# Patient Record
Sex: Female | Born: 1985 | Race: White | Hispanic: Yes | Marital: Married | Smoking: Never smoker
Health system: Southern US, Community
[De-identification: ages and names within clinical notes are randomized; demographics above are authoritative.]

---
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
## 2006-12-10 ENCOUNTER — Inpatient Hospital Stay (HOSPITAL_COMMUNITY): Admission: AD | Admit: 2006-12-10 | Discharge: 2006-12-12 | Payer: Self-pay | Admitting: Gynecology

## 2009-01-24 ENCOUNTER — Ambulatory Visit (HOSPITAL_COMMUNITY): Admission: RE | Admit: 2009-01-24 | Discharge: 2009-01-24 | Payer: Self-pay | Admitting: Obstetrics & Gynecology

## 2009-04-26 ENCOUNTER — Inpatient Hospital Stay (HOSPITAL_COMMUNITY): Admission: AD | Admit: 2009-04-26 | Discharge: 2009-04-28 | Payer: Self-pay | Admitting: Obstetrics & Gynecology

## 2009-04-26 ENCOUNTER — Inpatient Hospital Stay (HOSPITAL_COMMUNITY): Admission: AD | Admit: 2009-04-26 | Discharge: 2009-04-26 | Payer: Self-pay | Admitting: Family Medicine

## 2009-04-26 ENCOUNTER — Ambulatory Visit: Payer: Self-pay | Admitting: Advanced Practice Midwife

## 2010-11-29 NOTE — L&D Delivery Note (Signed)
Delivery Note At 4:05 AM a viable and healthy female was delivered via Vaginal, Spontaneous Delivery (Presentation: Right Occiput Anterior).  APGAR: 8, 9; weight 7 lb 8.5 oz (3415 g).   Placenta status: Intact, Spontaneous.  Cord: 3 vessels with the following complications: shoulder cord, reduced after delivery     Anesthesia: Epidural  Episiotomy: None Lacerations: None Est. Blood Loss (mL): 200  Mom to postpartum.  Baby to nursery-stable.  Mat Carne 09/09/2011, 4:39 AM

## 2011-03-09 LAB — CBC
HCT: 40.8 % (ref 36.0–46.0)
Hemoglobin: 14.4 g/dL (ref 12.0–15.0)
MCHC: 35.3 g/dL (ref 30.0–36.0)
MCV: 93.8 fL (ref 78.0–100.0)
Platelets: 139 10*3/uL — ABNORMAL LOW (ref 150–400)
RBC: 4.35 MIL/uL (ref 3.87–5.11)
RDW: 14.6 % (ref 11.5–15.5)
WBC: 13 10*3/uL — ABNORMAL HIGH (ref 4.0–10.5)

## 2011-03-09 LAB — RPR: RPR Ser Ql: NONREACTIVE

## 2011-04-07 ENCOUNTER — Other Ambulatory Visit: Payer: Self-pay | Admitting: Family Medicine

## 2011-04-07 DIAGNOSIS — Z3689 Encounter for other specified antenatal screening: Secondary | ICD-10-CM

## 2011-04-09 ENCOUNTER — Ambulatory Visit (HOSPITAL_COMMUNITY)
Admission: RE | Admit: 2011-04-09 | Discharge: 2011-04-09 | Disposition: A | Discharge status: Home / Self Care | Payer: Medicaid Other | Source: Ambulatory Visit | Attending: Family Medicine | Admitting: Family Medicine

## 2011-04-09 DIAGNOSIS — Z363 Encounter for antenatal screening for malformations: Secondary | ICD-10-CM | POA: Insufficient documentation

## 2011-04-09 DIAGNOSIS — Z3689 Encounter for other specified antenatal screening: Secondary | ICD-10-CM

## 2011-04-09 DIAGNOSIS — O358XX0 Maternal care for other (suspected) fetal abnormality and damage, not applicable or unspecified: Secondary | ICD-10-CM | POA: Insufficient documentation

## 2011-04-09 DIAGNOSIS — Z1389 Encounter for screening for other disorder: Secondary | ICD-10-CM | POA: Insufficient documentation

## 2011-04-30 ENCOUNTER — Inpatient Hospital Stay (HOSPITAL_COMMUNITY): Admission: AD | Admit: 2011-04-30 | Payer: Self-pay | Source: Ambulatory Visit | Admitting: Family Medicine

## 2011-09-08 ENCOUNTER — Encounter (HOSPITAL_COMMUNITY): Payer: Self-pay | Admitting: Anesthesiology

## 2011-09-08 ENCOUNTER — Encounter (HOSPITAL_COMMUNITY): Payer: Self-pay | Admitting: *Deleted

## 2011-09-08 ENCOUNTER — Inpatient Hospital Stay (HOSPITAL_COMMUNITY): Payer: Medicaid Other | Admitting: Anesthesiology

## 2011-09-08 ENCOUNTER — Inpatient Hospital Stay (HOSPITAL_COMMUNITY)
Admission: AD | Admit: 2011-09-08 | Discharge: 2011-09-10 | DRG: 775 | Disposition: A | Discharge status: Home / Self Care | Payer: Medicaid Other | Source: Ambulatory Visit | Attending: Obstetrics & Gynecology | Admitting: Obstetrics & Gynecology

## 2011-09-08 LAB — CBC
HCT: 39 % (ref 36.0–46.0)
Hemoglobin: 13.4 g/dL (ref 12.0–15.0)
MCH: 30.4 pg (ref 26.0–34.0)
MCHC: 34.4 g/dL (ref 30.0–36.0)
MCV: 88.4 fL (ref 78.0–100.0)
Platelets: 138 10*3/uL — ABNORMAL LOW (ref 150–400)
RBC: 4.41 MIL/uL (ref 3.87–5.11)
RDW: 15.5 % (ref 11.5–15.5)
WBC: 11 10*3/uL — ABNORMAL HIGH (ref 4.0–10.5)

## 2011-09-08 LAB — ANTIBODY SCREEN: Antibody Screen: NEGATIVE

## 2011-09-08 LAB — RPR: RPR: NONREACTIVE

## 2011-09-08 LAB — ABO/RH: RH Type: POSITIVE

## 2011-09-08 LAB — HIV ANTIBODY (ROUTINE TESTING W REFLEX): HIV: NONREACTIVE

## 2011-09-08 MED ORDER — HYDROXYZINE HCL 50 MG/ML IM SOLN
50.0000 mg | Freq: Four times a day (QID) | INTRAMUSCULAR | Status: DC | PRN
  Filled 2011-09-08: qty 1

## 2011-09-08 MED ORDER — LIDOCAINE HCL (PF) 1 % IJ SOLN
30.0000 mL | INTRAMUSCULAR | Status: DC | PRN
  Filled 2011-09-08: qty 30

## 2011-09-08 MED ORDER — LACTATED RINGERS IV SOLN
INTRAVENOUS | Status: DC
  Administered 2011-09-08: 125 mL/h via INTRAVENOUS

## 2011-09-08 MED ORDER — OXYTOCIN BOLUS FROM INFUSION
500.0000 mL | Freq: Once | INTRAVENOUS | Status: DC
  Filled 2011-09-08: qty 500
  Filled 2011-09-08: qty 1000

## 2011-09-08 MED ORDER — EPHEDRINE 5 MG/ML INJ
10.0000 mg | INTRAVENOUS | Status: DC | PRN
  Filled 2011-09-08 (×2): qty 4

## 2011-09-08 MED ORDER — LIDOCAINE HCL 1.5 % IJ SOLN
INTRAMUSCULAR | Status: DC | PRN
  Administered 2011-09-08 (×2): 5 mL via EPIDURAL

## 2011-09-08 MED ORDER — DIPHENHYDRAMINE HCL 50 MG/ML IJ SOLN: 12.5000 mg | INTRAMUSCULAR | Status: DC | PRN

## 2011-09-08 MED ORDER — CITRIC ACID-SODIUM CITRATE 334-500 MG/5ML PO SOLN: 30.0000 mL | ORAL | Status: DC | PRN

## 2011-09-08 MED ORDER — OXYCODONE-ACETAMINOPHEN 5-325 MG PO TABS: 2.0000 | ORAL_TABLET | ORAL | Status: DC | PRN

## 2011-09-08 MED ORDER — FENTANYL 2.5 MCG/ML BUPIVACAINE 1/10 % EPIDURAL INFUSION (WH - ANES)
INTRAMUSCULAR | Status: DC | PRN
  Administered 2011-09-08: 14 mL/h via EPIDURAL
  Administered 2011-09-09: 14 mL/h

## 2011-09-08 MED ORDER — PHENYLEPHRINE 40 MCG/ML (10ML) SYRINGE FOR IV PUSH (FOR BLOOD PRESSURE SUPPORT)
80.0000 ug | PREFILLED_SYRINGE | INTRAVENOUS | Status: DC | PRN
  Filled 2011-09-08: qty 5

## 2011-09-08 MED ORDER — ONDANSETRON HCL 4 MG/2ML IJ SOLN: 4.0000 mg | Freq: Four times a day (QID) | INTRAMUSCULAR | Status: DC | PRN

## 2011-09-08 MED ORDER — FENTANYL 2.5 MCG/ML BUPIVACAINE 1/10 % EPIDURAL INFUSION (WH - ANES)
14.0000 mL/h | INTRAMUSCULAR | Status: DC
  Filled 2011-09-08 (×2): qty 60

## 2011-09-08 MED ORDER — OXYTOCIN 20 UNITS IN LACTATED RINGERS INFUSION - SIMPLE
125.0000 mL/h | Freq: Once | INTRAVENOUS | Status: AC
  Administered 2011-09-09: 500 mL/h via INTRAVENOUS

## 2011-09-08 MED ORDER — EPHEDRINE 5 MG/ML INJ
10.0000 mg | INTRAVENOUS | Status: DC | PRN
  Filled 2011-09-08: qty 4

## 2011-09-08 MED ORDER — FLEET ENEMA 7-19 GM/118ML RE ENEM: 1.0000 | ENEMA | RECTAL | Status: DC | PRN

## 2011-09-08 MED ORDER — HYDROXYZINE HCL 50 MG PO TABS: 50.0000 mg | ORAL_TABLET | Freq: Four times a day (QID) | ORAL | Status: DC | PRN

## 2011-09-08 MED ORDER — LACTATED RINGERS IV SOLN
500.0000 mL | Freq: Once | INTRAVENOUS | Status: AC
  Administered 2011-09-08: 500 mL via INTRAVENOUS

## 2011-09-08 MED ORDER — NALBUPHINE SYRINGE 5 MG/0.5 ML
5.0000 mg | INJECTION | INTRAMUSCULAR | Status: DC | PRN
  Filled 2011-09-08: qty 0.5

## 2011-09-08 MED ORDER — LACTATED RINGERS IV SOLN: 500.0000 mL | INTRAVENOUS | Status: DC | PRN

## 2011-09-08 MED ORDER — PHENYLEPHRINE 40 MCG/ML (10ML) SYRINGE FOR IV PUSH (FOR BLOOD PRESSURE SUPPORT)
80.0000 ug | PREFILLED_SYRINGE | INTRAVENOUS | Status: DC | PRN
  Filled 2011-09-08 (×2): qty 5

## 2011-09-08 MED ORDER — IBUPROFEN 600 MG PO TABS: 600.0000 mg | ORAL_TABLET | Freq: Four times a day (QID) | ORAL | Status: DC | PRN

## 2011-09-08 MED ORDER — ACETAMINOPHEN 325 MG PO TABS: 650.0000 mg | ORAL_TABLET | ORAL | Status: DC | PRN

## 2011-09-08 NOTE — H&P (Signed)
Marissa Meyers is a 25 y.o. female [phone] with IUP at Unknown presenting for labor. Pt states she has been having regular, every 2-3 minutes, associated with none vaginal bleeding, intact, with active.  Marland Kitchen  [facility]  Prenatal History/Complications:none  Past Medical History: History reviewed. No pertinent past medical history.  Past Surgical History: History reviewed. No pertinent past surgical history.  Obstetrical History: OB History    Grav Para Term Preterm Abortions TAB SAB Ect Mult Living   4 3 3       3       Gynecological History: OB History    Grav Para Term Preterm Abortions TAB SAB Ect Mult Living   4 3 3       3       Social History: History   Social History  . Marital Status: Married    Spouse Name: N/A    Number of Children: N/A  . Years of Education: N/A   Social History Main Topics  . Smoking status: Never Smoker   . Smokeless tobacco: None  . Alcohol Use: No  . Drug Use: No  . Sexually Active: Not Currently   Other Topics Concern  . None   Social History Narrative  . None    Family History: History reviewed. No pertinent family history.  Allergies: Allergies not on file  No prescriptions prior to admission    Review of Systems - Negative except contractions, getting stronger   Blood pressure 113/61, pulse 72, temperature 98 F (36.7 C), temperature source Oral, resp. rate 18. General appearance: alert, cooperative and mild distress Lungs: clear to auscultation bilaterally Heart: regular rate and rhythm Abdomen: soft, non-tender; bowel sounds normal Pelvic: 3.5/60/-2: (change from 3/50/-2 1 hour ago) Extremities: Homans sign is negative, no sign of DVT DTR's 2+ Presentation: cephalic Baseline: 150 bpm Date/time of onset: several hours ago, Frequency: Every 2-3 minutes, Duration: 60 seconds and Intensity: moderate Dilation: 3 Effacement (%): 50 Station: -2 Exam by:: Cira Servant   Prenatal labs: ABO,  Rh:  A+   Antibody:  neg Rubella:  immune RPR:   neg HBsAg:   neg HIV:   neg GBS:   neg 1 hr Glucola 103 Genetic screening  normal Anatomy US normal   Assessment: Marissa Meyers is a 25 y.o. [id number] with an IUP at 40presenting for labor  Plan: admit   CRESENZO-DISHMAN,Shaniquia Brafford 09/08/2011, 9:38 PM

## 2011-09-08 NOTE — Anesthesia Procedure Notes (Signed)
Epidural Patient location during procedure: OB Start time: 09/08/2011 11:26 PM End time: 09/08/2011 11:34 PM Reason for block: procedure for pain  Staffing Anesthesiologist: Sandrea Hughs  Preanesthetic Checklist Completed: patient identified, site marked, surgical consent, pre-op evaluation, timeout performed, IV checked, risks and benefits discussed and monitors and equipment checked  Epidural Patient position: sitting Prep: site prepped and draped and DuraPrep Patient monitoring: continuous pulse ox and blood pressure Approach: midline Injection technique: LOR air  Needle:  Needle type: Tuohy  Needle gauge: 17 G Needle length: 9 cm Needle insertion depth: 5 cm cm Catheter type: closed end flexible Catheter size: 19 Gauge Catheter at skin depth: 10 cm Test dose: negative and 1.5% lidocaine  Assessment Events: blood not aspirated, injection not painful, no injection resistance, negative IV test and no paresthesia

## 2011-09-08 NOTE — Progress Notes (Signed)
   Marissa Meyers is a 24 y.o. G4P3003 at [redacted]w[redacted]d by ultrasound admitted for active labor  Subjective:   Objective: BP 112/55  Pulse 82  Temp(Src) 97.7 F (36.5 C) (Oral)  Resp 18  Ht 5\' 2"  (1.575 m)  Wt 69.4 kg (153 lb)  BMI 27.98 kg/m2  SpO2 99%    FHT:  FHR: 140 bpm, variability: moderate,  accelerations:  Present,  decelerations:  Absent UC:   regular, every 2-3 minutes SVE:   4/80/-2  Labs: Lab Results  Component Value Date   WBC 11.0* 09/08/2011   HGB 13.4 09/08/2011   HCT 39.0 09/08/2011   MCV 88.4 09/08/2011   PLT 138* 09/08/2011    Assessment / Plan: Spontaneous labor, progressing normally  Labor: Progressing normally Fetal Wellbeing:  Category I Pain Control:  pt requests epidural Anticipated MOD:  NSVD  CRESENZO-DISHMAN,Rector Devonshire 09/08/2011, 11:41 PM

## 2011-09-08 NOTE — Progress Notes (Signed)
Pt reports uc's since last night, but now UC's are stronger and closer together.. Denies LOF

## 2011-09-08 NOTE — Anesthesia Preprocedure Evaluation (Signed)
Anesthesia Evaluation  Name, MR# and DOB Patient awake  General Assessment Comment  Reviewed: Allergy & Precautions, H&P , NPO status , Patient's Chart, lab work & pertinent test results  Airway Mallampati: I TM Distance: >3 FB Neck ROM: full    Dental No notable dental hx.    Pulmonary    Pulmonary exam normal       Cardiovascular     Neuro/Psych Negative Neurological ROS  Negative Psych ROS   GI/Hepatic negative GI ROS Neg liver ROS    Endo/Other  Negative Endocrine ROS  Renal/GU negative Renal ROS  Genitourinary negative   Musculoskeletal negative musculoskeletal ROS (+)   Abdominal Normal abdominal exam  (+)   Peds  Hematology negative hematology ROS (+)   Anesthesia Other Findings   Reproductive/Obstetrics (+) Pregnancy                           Anesthesia Physical Anesthesia Plan  ASA: II  Anesthesia Plan: Epidural   Post-op Pain Management:    Induction:   Airway Management Planned:   Additional Equipment:   Intra-op Plan:   Post-operative Plan:   Informed Consent: I have reviewed the patients History and Physical, chart, labs and discussed the procedure including the risks, benefits and alternatives for the proposed anesthesia with the patient or authorized representative who has indicated his/her understanding and acceptance.     Plan Discussed with:   Anesthesia Plan Comments:         Anesthesia Quick Evaluation  

## 2011-09-09 ENCOUNTER — Encounter (HOSPITAL_COMMUNITY): Payer: Self-pay

## 2011-09-09 LAB — RPR: RPR Ser Ql: NONREACTIVE

## 2011-09-09 LAB — ABO/RH: ABO/RH(D): O POS

## 2011-09-09 MED ORDER — DIPHENHYDRAMINE HCL 25 MG PO CAPS: 25.0000 mg | ORAL_CAPSULE | Freq: Four times a day (QID) | ORAL | Status: DC | PRN

## 2011-09-09 MED ORDER — TETANUS-DIPHTH-ACELL PERTUSSIS 5-2.5-18.5 LF-MCG/0.5 IM SUSP: 0.5000 mL | Freq: Once | INTRAMUSCULAR | Status: DC

## 2011-09-09 MED ORDER — BENZOCAINE-MENTHOL 20-0.5 % EX AERO: 1.0000 "application " | INHALATION_SPRAY | CUTANEOUS | Status: DC | PRN

## 2011-09-09 MED ORDER — DIBUCAINE 1 % RE OINT: 1.0000 "application " | TOPICAL_OINTMENT | RECTAL | Status: DC | PRN

## 2011-09-09 MED ORDER — IBUPROFEN 600 MG PO TABS
600.0000 mg | ORAL_TABLET | Freq: Four times a day (QID) | ORAL | Status: DC
  Administered 2011-09-09 – 2011-09-10 (×5): 600 mg via ORAL
  Filled 2011-09-09 (×5): qty 1

## 2011-09-09 MED ORDER — ONDANSETRON HCL 4 MG PO TABS: 4.0000 mg | ORAL_TABLET | ORAL | Status: DC | PRN

## 2011-09-09 MED ORDER — SENNOSIDES-DOCUSATE SODIUM 8.6-50 MG PO TABS
2.0000 | ORAL_TABLET | Freq: Every day | ORAL | Status: DC
  Administered 2011-09-09: 2 via ORAL

## 2011-09-09 MED ORDER — OXYCODONE-ACETAMINOPHEN 5-325 MG PO TABS: 1.0000 | ORAL_TABLET | ORAL | Status: DC | PRN

## 2011-09-09 MED ORDER — PRENATAL PLUS 27-1 MG PO TABS
1.0000 | ORAL_TABLET | Freq: Every day | ORAL | Status: DC
  Administered 2011-09-09 – 2011-09-10 (×2): 1 via ORAL
  Filled 2011-09-09 (×2): qty 1

## 2011-09-09 MED ORDER — ZOLPIDEM TARTRATE 5 MG PO TABS: 5.0000 mg | ORAL_TABLET | Freq: Every evening | ORAL | Status: DC | PRN

## 2011-09-09 MED ORDER — ONDANSETRON HCL 4 MG/2ML IJ SOLN: 4.0000 mg | INTRAMUSCULAR | Status: DC | PRN

## 2011-09-09 MED ORDER — SIMETHICONE 80 MG PO CHEW: 80.0000 mg | CHEWABLE_TABLET | ORAL | Status: DC | PRN

## 2011-09-09 MED ORDER — WITCH HAZEL-GLYCERIN EX PADS: 1.0000 "application " | MEDICATED_PAD | CUTANEOUS | Status: DC | PRN

## 2011-09-09 MED ORDER — LANOLIN HYDROUS EX OINT: TOPICAL_OINTMENT | CUTANEOUS | Status: DC | PRN

## 2011-09-09 NOTE — Anesthesia Postprocedure Evaluation (Signed)
  Anesthesia Post-op Note  Patient: Marissa Meyers  Procedure(s) Performed: * No procedures listed *  Patient Location: Mother/Baby  Anesthesia Type: Epidural  Level of Consciousness: awake, alert  and oriented  Airway and Oxygen Therapy: Patient Spontanous Breathing  Post-op Pain: none  Post-op Assessment: Post-op Vital signs reviewed, Patient's Cardiovascular Status Stable, No headache, No backache, No residual numbness and No residual motor weakness  Post-op Vital Signs: Reviewed and stable  Complications: No apparent anesthesia complications

## 2011-09-09 NOTE — Anesthesia Postprocedure Evaluation (Signed)
Anesthesia Post Note  Patient: Marissa Meyers  Procedure(s) Performed: * No procedures listed *  Anesthesia type: Epidural  Patient location: Mother/Baby  Post pain: Pain level controlled  Post assessment: Post-op Vital signs reviewed  Last Vitals:  Filed Vitals:   09/09/11 0517  BP: 90/52  Pulse: 73  Temp:   Resp: 18    Post vital signs: Reviewed  Level of consciousness: awake  Complications: No apparent anesthesia complications

## 2011-09-09 NOTE — Progress Notes (Signed)
Encounter addended by: Madison Hickman on: 09/09/2011  8:26 AM<BR>     Documentation filed: Notes Section, Charges VN

## 2011-09-09 NOTE — Progress Notes (Signed)
Called to evaluate EFM strip:  Baseline 170's with late decels vs baseline 150's with + accels.  Fetus very active, pt afebrile, skin cool to touch.  CX 6/80/-1, + scalp stimulation.  AROM with scant amt clear fluid.  FSE applied.   FHR baseline now 150's, avg LTV, + accels, no decels.  Anticipate SVD soon.  Pt comfortable with epidural.

## 2011-09-09 NOTE — Progress Notes (Signed)
I was present for the delivery and agree with Dr. Yetta Numbers note

## 2011-09-09 NOTE — Progress Notes (Signed)
UR Chart review completed.  

## 2011-09-10 NOTE — Discharge Summary (Signed)
Obstetric Discharge Summary Reason for Admission: onset of labor on 09/08/11 Prenatal Procedures: ultrasound Intrapartum Procedures: spontaneous vaginal delivery Postpartum Procedures: none Complications-Operative and Postpartum: none Hemoglobin  Date Value Range Status  09/08/2011 13.4  12.0-15.0 (g/dL) Final     HCT  Date Value Range Status  09/08/2011 39.0  36.0-46.0 (%) Final   Hospital Course: mrs. Dede is a 25 year old G48P004 who presented at 40.2 in active labor. She delivered a baby girl via NSVD on 09/09/11.The only complication was a shoulder cord, which was quickly reduced. The postpartum period is without complication and the patient is stable and ready for discharge. She will follow up at [facility]. She is breast feeding and plans to use an IUD for future contraception.   Discharge Diagnoses: Term Pregnancy-delivered   Discharge Information: Date: 09/10/2011 Activity: pelvic rest Diet: routine Medications: None Condition: stable Instructions: refer to practice specific booklet Discharge to: home Follow-up Information    Follow up with [facility]. Make an appointment in 6 weeks.         Newborn Data: Live born female  Birth Weight: 7 lb 8.5 oz (3416 g) APGAR: 8, 9  Home with mother.  Clinton Sawyer, Gerik Coberly 09/10/2011, 1:08 PM

## 2011-09-10 NOTE — Progress Notes (Signed)
Post Partum Day 1 for NSVD Subjective: no complaints, up ad lib, voiding and tolerating PO  Objective: Blood pressure 99/63, pulse 65, temperature 97.5 F (36.4 C), temperature source Oral, resp. rate 18, height 5\' 2"  (1.575 m), weight 153 lb (69.4 kg), SpO2 99.00%, unknown if currently breastfeeding. Is currently breast feeding   Physical Exam:  General: alert, cooperative and no distress Lochia: appropriate Uterine Fundus: firm DVT Evaluation: No evidence of DVT seen on physical exam.   Basename 09/08/11 2200  HGB 13.4  HCT 39.0    Assessment/Plan: Discharge home, Breastfeeding and Contraception Mirena will be started at 6 week f/u   LOS: 2 days   Mat Carne 09/10/2011, 1:05 PM

## 2011-09-13 NOTE — Discharge Summary (Signed)
Attestation of Attending Supervision of Resident: Evaluation and management procedures were performed by the [facility] Resident under my supervision.  I have reviewed the resident's note, chart reviewed and agree with management and plan.  Marissa Meyers,Marissa Meyers 09/13/2011 12:06 PM

## 2012-03-25 ENCOUNTER — Ambulatory Visit: Payer: Self-pay | Admitting: Family Medicine

## 2012-03-25 DIAGNOSIS — J309 Allergic rhinitis, unspecified: Secondary | ICD-10-CM

## 2012-03-25 DIAGNOSIS — H1010 Acute atopic conjunctivitis, unspecified eye: Secondary | ICD-10-CM

## 2012-03-25 MED ORDER — PREDNISONE 20 MG PO TABS: 20.0000 mg | ORAL_TABLET | Freq: Every day | ORAL | Status: AC

## 2012-03-25 NOTE — Progress Notes (Signed)
  Subjective:    Patient ID: Marissa Meyers, female    DOB: 07-23-1986, 26 y.o.   MRN: [id number]  HPI  Patient presents complaining of significant facial and nasal congestion. ST, PND and otalgia Occasional cough; no SOB or wheezing  Has taken cliaritin the last 2 days.  6 mo infant; nursing mother  Review of Systems  Constitutional: Positive for fatigue. Negative for fever and chills.       Objective:   Physical Exam  Constitutional: She appears well-developed.  HENT:  Nose: Mucosal edema present.  Eyes:    Neck: Neck supple.  Cardiovascular: Normal rate, regular rhythm and normal heart sounds.   Pulmonary/Chest: Effort normal and breath sounds normal.  Lymphadenopathy:    She has cervical adenopathy.  Neurological: She is alert.  Skin: Skin is warm.        Assessment & Plan:   1. Allergic rhinitis  predniSONE (DELTASONE) 20 MG tablet  2. Conjunctivitis, allergic     See patient instructions

## 2012-03-25 NOTE — Patient Instructions (Signed)
Allergies  Claritin 1 every day  Allaway eye drops 1-2 drops in each eye twice daily  Prednisone 20 mg 1 daily for 7 days

## 2014-04-12 ENCOUNTER — Encounter (HOSPITAL_COMMUNITY): Payer: Self-pay | Admitting: Emergency Medicine

## 2014-04-12 ENCOUNTER — Emergency Department (HOSPITAL_COMMUNITY)
Admission: EM | Admit: 2014-04-12 | Discharge: 2014-04-13 | Disposition: A | Discharge status: Home / Self Care | Payer: Medicaid Other | Attending: Emergency Medicine | Admitting: Emergency Medicine

## 2014-04-12 DIAGNOSIS — S01501A Unspecified open wound of lip, initial encounter: Secondary | ICD-10-CM | POA: Insufficient documentation

## 2014-04-12 DIAGNOSIS — S01511A Laceration without foreign body of lip, initial encounter: Secondary | ICD-10-CM

## 2014-04-12 MED ORDER — HYDROCODONE-ACETAMINOPHEN 5-325 MG PO TABS
1.0000 | ORAL_TABLET | Freq: Once | ORAL | Status: AC
  Administered 2014-04-12: 1 via ORAL
  Filled 2014-04-12: qty 1

## 2014-04-12 MED ORDER — BUPIVACAINE HCL (PF) 0.5 % IJ SOLN
10.0000 mL | Freq: Once | INTRAMUSCULAR | Status: AC
  Administered 2014-04-12: 10 mL
  Filled 2014-04-12: qty 10
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
## 2014-04-13 MED ORDER — HYDROCODONE-ACETAMINOPHEN 5-325 MG PO TABS: 1.0000 | ORAL_TABLET | ORAL | Status: DC | PRN
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
## 2014-04-13 NOTE — Discharge Instructions (Signed)
Take vicodin as prescribed for severe pain.   Do not drive within four hours of taking this medication (may cause drowsiness or confusion).  Keep wound clean and dry.  Follow up with your primary care doctor or [facility] [phone]; [address]) in 7-10 days for wound recheck and suture removal.  You should be seen sooner if you develop fever, worsening pain or redness/drainage of pus at site of wound.     Laceracin facial (Facial Laceration)  Una laceracin facial es un corte en el rostro. Estas lesiones pueden ser dolorosas y Nepalcausan sangrado. Generalmente se curan rpido, pero puede ser necesario un cuidado especial para reducir las cicatrices. DIAGNSTICO  Su mdico realizar la historia clnica, preguntar detalles sobre cmo ocurri la lesin y examinar la herida para determinar cun profundo es el corte. TRATAMIENTO  Algunas laceraciones faciales no requieren sutura. Otras laceraciones quizs no puedan cerrarse debido a un aumento del riesgo de infeccin. El riesgo de infeccin y la probabilidad de que la herida se cierre correctamente dependern de diversos factores, incluido el tiempo transcurrido desde que ocurri la lesin. La herida puede limpiarse para ayudar a prevenir una infeccin. Si la herida se cierra adecuadamente, podrn indicarle analgsicos, si los necesita. Su mdico usar puntos (suturas), pegamento para heridas Turner Daniels(adhesivo) o tiras [phone] para la piel para Environmental consultantreparar la laceracin. Estos elementos mantendrn unidos los bordes de la piel para que se cure ms rpidamente y para Baristaobtener un mejor resultado cosmtico. Si es necesario, es posible que le administren una vacuna contra el ttanos. INSTRUCCIONES PARA EL CUIDADO EN EL HOGAR  Tome solo medicamentos de venta libre o recetados, segn las indicaciones del mdico.  Siga las indicaciones de su mdico para el cuidado de la herida. Estas indicaciones variarn segn la tcnica Kazakhstanutilizada para cerrar la herida. Si  tiene suturas:  Mantenga la herida limpia y [place] ([place] Islandsseca.  Si le colocaron una venda (vendaje), debe cambiarla al menos una vez al da. Adems, cambie el vendaje si este se moja o se ensucia, o segn las instrucciones de su mdico.  Lave la herida dos veces por da con agua y Dietrichjabn. Enjuguela con agua para quitar todo el Belarusjabn. Seque dando palmaditas con una toalla limpia y seca.  Despus de limpiar, aplique una delgada capa del ungento con antibitico recomendado por su mdico. Esto le ayudar a prevenir las infecciones y a Automotive engineerevitar que el vendaje se Building services engineeradhiera.  Puede ducharse despus de las primeras 24 horas. No moje la herida hasta que le hayan quitado las suturas.  Qutese las suturas segn las indicaciones de su mdico. Con las laceraciones faciales, las suturas normalmente deben retirarse despus de 4 a 5das para evitar las marcas de los puntos.  Espere algunos [employer], Incdas luego de que le hayan retirado las suturas antes de Garment/textile technologistaplicar maquillaje. En caso que tenga tiras MWNUUVOZDadhesivas para la piel:  Mantenga la herida limpia y seca.  No deje que las tiras [phone]adhesivas se mojen. Puede darse un bao pero asegrese de que la herida se mantenga seca.  Si se moja, squela dando palmaditas con una toalla limpia.  Las tiras caern por s mismas. Puede recortar las tiras a medida que la herida se [place]ub[place]. No quite las tiras Miesvilleadhesivas para la piel que an estn adheridas a la herida. Ellas se caern cuando sea el momento. En caso que le hayan aplicado adhesivo:  Podr mojar la herida solo por un memento, en la ducha o el bao. No frote ni sumerja  la herida. No practique natacin. Evite transpirar con abundancia hasta que el Somersadhesivo para la piel se haya cado solo. Despus de ducharse o darse un bao, seque el corte dando palmaditas con una toalla limpia.  No aplique medicamentos lquidos, en crema o ungentos ni maquillaje en su herida mientras el QUALCOMMadhesivo para la piel est en su lugar. Podr aflojarlo antes de que la  herida se cure.  Si tiene un vendaje, tenga cuidado de no aplicar cinta adhesiva directamente Lehman Brotherssobre el adhesivo. Esto puede hacer que el Bayportadhesivo se caiga antes de que la herida se haya curado.  Evite la exposicin prolongada a Secretary/administratorla luz solar o a las Sport and exercise psychologistlmparas solares mientras el adhesivo para la piel se Arts administratorencuentre en el lugar.  Por lo general, el adhesivo para Engineer, materialsla piel permanecer en su lugar durante 5 a 10das y Therapist, occupationalluego caer naturalmente. No quite la pelcula de Zearingadhesivo. Despus de la curacin: Una vez que la herida se haya curado, proteja la herida del sol durante un ao, colocando pantalla Statisticiansolar durante el da. Esto puede ayudar a reducir las cicatrices. La exposicin a los Hydrographic surveyorrayos ultravioletas durante el primer ao oscurecer la Training and development officercicatriz. Pueden transcurrir entre uno y dosaos hasta que la cicatriz se cure completamente y pierda el color rojo.  SOLICITE ATENCIN MDICA DE INMEDIATO SI:  Tiene enrojecimiento, dolor o hinchazn alrededor de la herida.  Observa una secrecin de color blanco amarillento (pus) en la herida.  Tiene escalofros o fiebre. ASEGRESE DE QUE:  Comprende estas instrucciones.  Controlar su afeccin.  Recibir ayuda de inmediato si no mejora o si empeora. Document Released: 11/15/2005 Document Revised: 09/05/2013 [facility] Patient Information 2014 [place], [place].

## 2014-04-18 NOTE — ED Provider Notes (Signed)
Medical screening examination/treatment/procedure(s) were performed by non-physician practitioner and as supervising physician I was immediately available for consultation/collaboration.   EKG Interpretation None        Brandt LoosenJulie Doak Mah, MD 04/18/14 [phone]

## 2014-04-20 ENCOUNTER — Ambulatory Visit: Payer: Self-pay | Admitting: Physician Assistant

## 2014-04-20 VITALS — BP 102/58 | HR 68 | Temp 98.4°F | Resp 16 | Ht 60.0 in | Wt 128.0 lb

## 2014-04-20 DIAGNOSIS — Z4802 Encounter for removal of sutures: Secondary | ICD-10-CM

## 2014-04-21 NOTE — Progress Notes (Signed)
   Subjective:    Patient ID: Carolee Rota, female    DOB: 1986-10-05, 28 y.o.   MRN: [id number]  Suture / Staple Removal     Ms. Rezai is a very pleasant 28 yr old female here for suture removal.  Sutures were placed her lower lip 04/12/14 at [facility] ED.  She reports that she is doing well.  No swelling, pain, or drainage.   Review of Systems  Constitutional: Negative for fever and chills.  Respiratory: Negative.   Cardiovascular: Negative.   Skin: Positive for wound.       Objective:   Physical Exam  Vitals reviewed. Constitutional: She is oriented to person, place, and time. She appears well-developed and well-nourished. No distress.  HENT:  Head: Normocephalic and atraumatic.  Mouth/Throat:    Well healed laceration at lower lip, includes vermilion border; edges very well approximated; sutures removed without difficulty  Eyes: Conjunctivae are normal. No scleral icterus.  Pulmonary/Chest: Effort normal.  Neurological: She is alert and oriented to person, place, and time.  Skin: Skin is warm and dry.  Psychiatric: She has a normal mood and affect. Her behavior is normal.       Assessment & Plan:  Encounter for removal of sutures   Ms. Yenter is a very pleasant 28 yr old female here for suture removal.  Sutures were removed without difficulty.  Pt to follow up as needs arise    E. Frances Furbish MHS, PA-C [facility] 5/24/20157:58 AM

## 2014-04-29 ENCOUNTER — Ambulatory Visit: Payer: Self-pay | Admitting: Physician Assistant

## 2014-04-29 VITALS — BP 112/70 | HR 65 | Temp 98.3°F | Resp 12 | Ht 59.0 in | Wt 142.0 lb

## 2014-04-29 DIAGNOSIS — S01501A Unspecified open wound of lip, initial encounter: Secondary | ICD-10-CM

## 2014-04-29 NOTE — Progress Notes (Signed)
   Subjective:    Patient ID: Marissa Meyers, female    DOB: June 07, 1986, 28 y.o.   MRN: [id number]  HPI Primary Physician: PROVIDER NOT IN SYSTEM  Chief Complaint: Lip pain  HPI: 28 y.o. female with history below presents with lower lip pain. Patient had primary repair of lip laceration at [facility] on 04/12/14 with 2 Vicryl and and 5 Prolene. Had suture removal on 04/20/14. She notes pain and and extruding suture from the wound. No drainage or discharge. Afebrile. No chills. No swelling. No erythema.    No past medical history on file.   Home Meds: Prior to Admission medications   Medication Sig Start Date End Date Taking? Authorizing Provider  HYDROcodone-acetaminophen (NORCO/VICODIN) 5-325 MG per tablet Take 1 tablet by mouth every 4 (four) hours as needed for moderate pain. 04/13/14   Arie Sabina Schinlever, PA-C    Allergies: No Known Allergies  History   Social History  . Marital Status: Married    Spouse Name: N/A    Number of Children: N/A  . Years of Education: N/A   Occupational History  . Not on file.   Social History Main Topics  . Smoking status: Never Smoker   . Smokeless tobacco: Not on file  . Alcohol Use: No  . Drug Use: No  . Sexual Activity: Not Currently   Other Topics Concern  . Not on file   Social History Narrative  . No narrative on file     Review of Systems  Constitutional: Negative for fever and chills.  Skin: Positive for wound. Negative for color change.       Objective:   Physical Exam  Physical Exam: Blood pressure 112/70, pulse 65, temperature 98.3 F (36.8 C), temperature source Oral, resp. rate 12, height 4\' 11"  (1.499 m), weight 142 lb (64.411 kg), SpO2 100.00%., Body mass index is 28.67 kg/(m^2). General: Well developed, well nourished, in no acute distress. Head: Normocephalic, atraumatic, eyes without discharge, sclera non-icteric, nares are without discharge.    Neck: Supple. Full ROM.  Lungs: Breathing is  unlabored. Heart: Regular rate. Msk:  Strength and tone normal for age. Extremities/Skin: Warm and dry. No clubbing or cyanosis. No edema. No rashes or suspicious lesions. Partially extruded Vicryl suture from well healed lip laceration. VCO. 2% plain lidocaine 0.5 cc injected for local anesthesia. Suture lifted with pickups and cut below the knot with an 11 blade. Suture removed. No drainage, erythema, warmth, or STS.  Neuro: Alert and oriented X 3. Moves all extremities spontaneously. Gait is normal. CNII-XII grossly in tact. Psych:  Responds to questions appropriately with a normal affect.        Assessment & Plan:  28 year old female partially extruded Vicryl suture from well healed lip laceration and language barrier.   1) Wound lip -Vicryl suture removed per above -Warm compresses -Follow up prn  2) Language barrier   Eula Listen, MHS, PA-C [facility] [address] [place], [place] [address] [phone] [facility] 04/29/2014 8:39 PM

## 2020-11-19 ENCOUNTER — Other Ambulatory Visit: Payer: Self-pay

## 2020-11-19 DIAGNOSIS — N6310 Unspecified lump in the right breast, unspecified quadrant: Secondary | ICD-10-CM

## 2020-12-09 ENCOUNTER — Other Ambulatory Visit: Payer: Self-pay | Admitting: Obstetrics and Gynecology

## 2020-12-09 ENCOUNTER — Encounter (INDEPENDENT_AMBULATORY_CARE_PROVIDER_SITE_OTHER): Payer: Self-pay

## 2020-12-09 ENCOUNTER — Ambulatory Visit
Admission: RE | Admit: 2020-12-09 | Discharge: 2020-12-09 | Disposition: A | Discharge status: Home / Self Care | Payer: No Typology Code available for payment source | Source: Ambulatory Visit | Attending: Obstetrics and Gynecology | Admitting: Obstetrics and Gynecology

## 2020-12-09 ENCOUNTER — Other Ambulatory Visit: Payer: Self-pay

## 2020-12-09 ENCOUNTER — Ambulatory Visit: Payer: No Typology Code available for payment source | Admitting: *Deleted

## 2020-12-09 VITALS — BP 114/74 | Wt 142.8 lb

## 2020-12-09 DIAGNOSIS — N6311 Unspecified lump in the right breast, upper outer quadrant: Secondary | ICD-10-CM

## 2020-12-09 DIAGNOSIS — N6310 Unspecified lump in the right breast, unspecified quadrant: Secondary | ICD-10-CM

## 2020-12-09 DIAGNOSIS — Z1239 Encounter for other screening for malignant neoplasm of breast: Secondary | ICD-10-CM

## 2020-12-10 NOTE — Progress Notes (Addendum)
[name] is a 35 y.o. female who presents to [facility] clinic today with complaint of right breast lump x around one year.    Pap Smear: Pap smear not completed today. Last Pap smear was 07/15/2020 at the [facility] clinic and was normal with negative HPV. Per patient has no history of an abnormal Pap smear. Last Pap smear result is available in Epic.   Physical exam: Breasts Breasts symmetrical. No skin abnormalities bilateral breasts. No nipple retraction bilateral breasts. No nipple discharge bilateral breasts. No lymphadenopathy. No lumps palpated left breast. Palpated two lumps within the right breast at 10 o'clock 3 cm from the nipple and a bb sized lump at 10 o'clock next to areola. No complaints of pain or tenderness on exam.    Pelvic/Bimanual Pap is not indicated today per BCCCP guidelines.   Smoking History: Patient has never smoked.   Patient Navigation: Patient education provided. Access to services provided for patient through [place] program. Spanish interpreter Marissa Meyers from [employer] [facility] [employer] provided.   Breast and Cervical Cancer Risk Assessment: Patient does not have family history of breast cancer, known genetic mutations, or radiation treatment to the chest before age 4. Patient does not have history of cervical dysplasia, immunocompromised, or DES exposure in-utero. Breast cancer risk assessment completed. No breast cancer risk calculated due to patient is less than 34 years old.  Risk Assessment    Risk Scores      12/09/2020   Last edited by: Marissa Rutherford, LPN   5-year risk:    Lifetime risk:          A: BCCCP exam without pap smear Complaint of right breast lump.  P: Referred patient to the [facility] for a diagnostic mammogram. Appointment scheduled Tuesday, December 09, 2020 at 1320.  Marissa Heidelberg, RN 12/09/2020 11:00 AM

## 2020-12-10 NOTE — Patient Instructions (Signed)
Explained breast self awareness with Marissa Meyers. Patient did not need a Pap smear today due to last Pap smear and HPV typing was 07/15/2020. Let her know BCCCP will cover Pap smears and HPV typing every 5 years unless has a history of abnormal Pap smears. Referred patient to the [facility] for a diagnostic mammogram. Appointment scheduled Tuesday, December 09, 2020 at 1320. Patient aware of appointment and will be there. Marissa Meyers verbalized understanding.  Falisa Lamora, Kathaleen Maser, RN 11:00 AM

## 2020-12-22 ENCOUNTER — Other Ambulatory Visit: Payer: Self-pay

## 2020-12-22 ENCOUNTER — Ambulatory Visit
Admission: RE | Admit: 2020-12-22 | Discharge: 2020-12-22 | Disposition: A | Discharge status: Home / Self Care | Payer: No Typology Code available for payment source | Source: Ambulatory Visit | Attending: Obstetrics and Gynecology | Admitting: Obstetrics and Gynecology

## 2020-12-22 DIAGNOSIS — N6310 Unspecified lump in the right breast, unspecified quadrant: Secondary | ICD-10-CM

## 2021-08-02 IMAGING — MG MM BREAST LOCALIZATION CLIP
4 series · 4 of 12 positions shown · non-contrast
Comparison: Prior films

CLINICAL DATA: Status post ultrasound-guided core biopsy of right
breast mass

EXAM:
DIAGNOSTIC right MAMMOGRAM POST ultrasound BIOPSY

[R ML synth-2D]
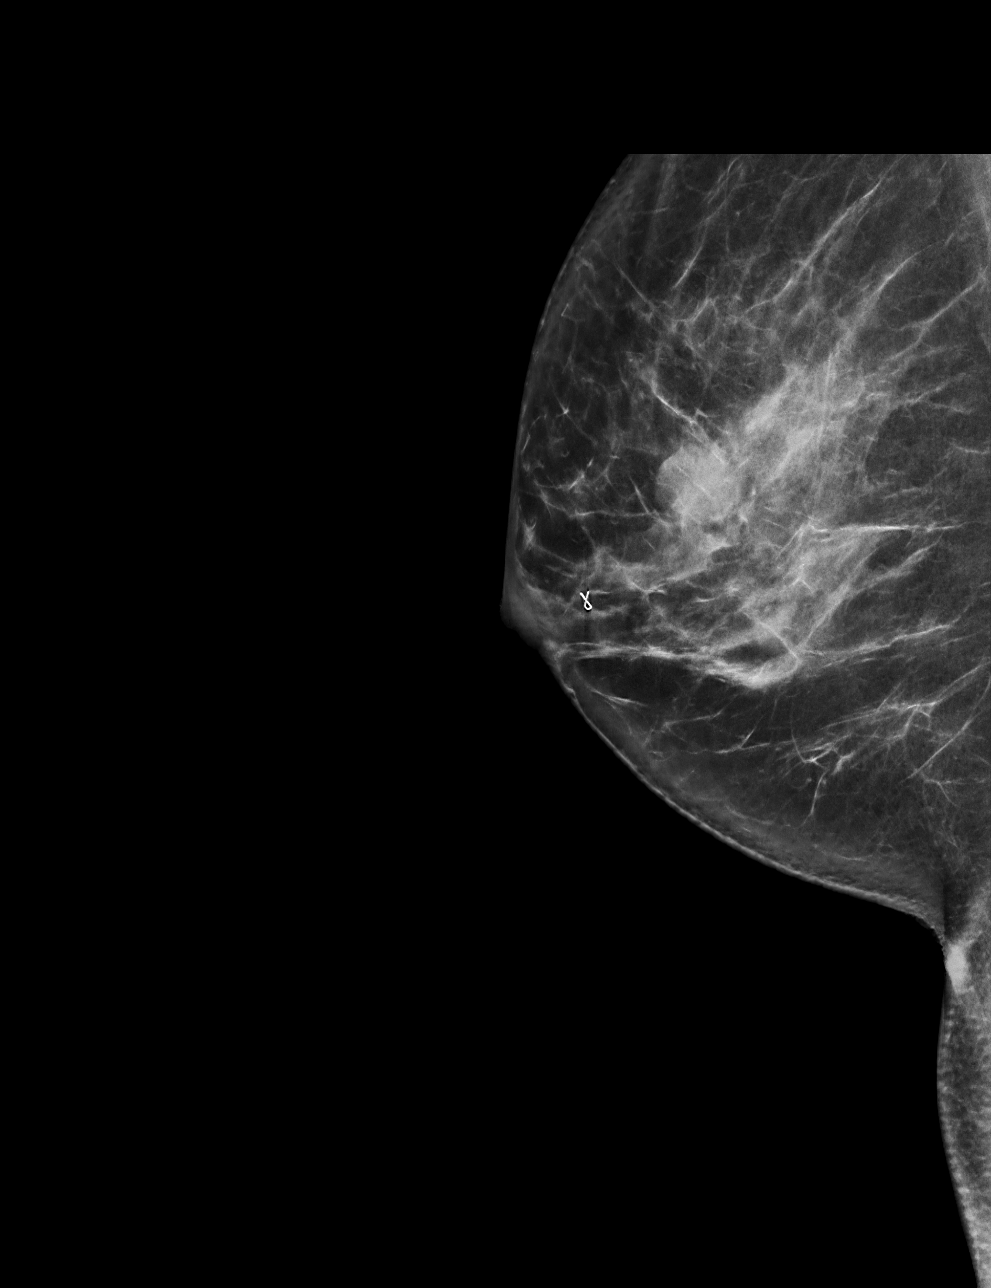

[R CC synth-2D]
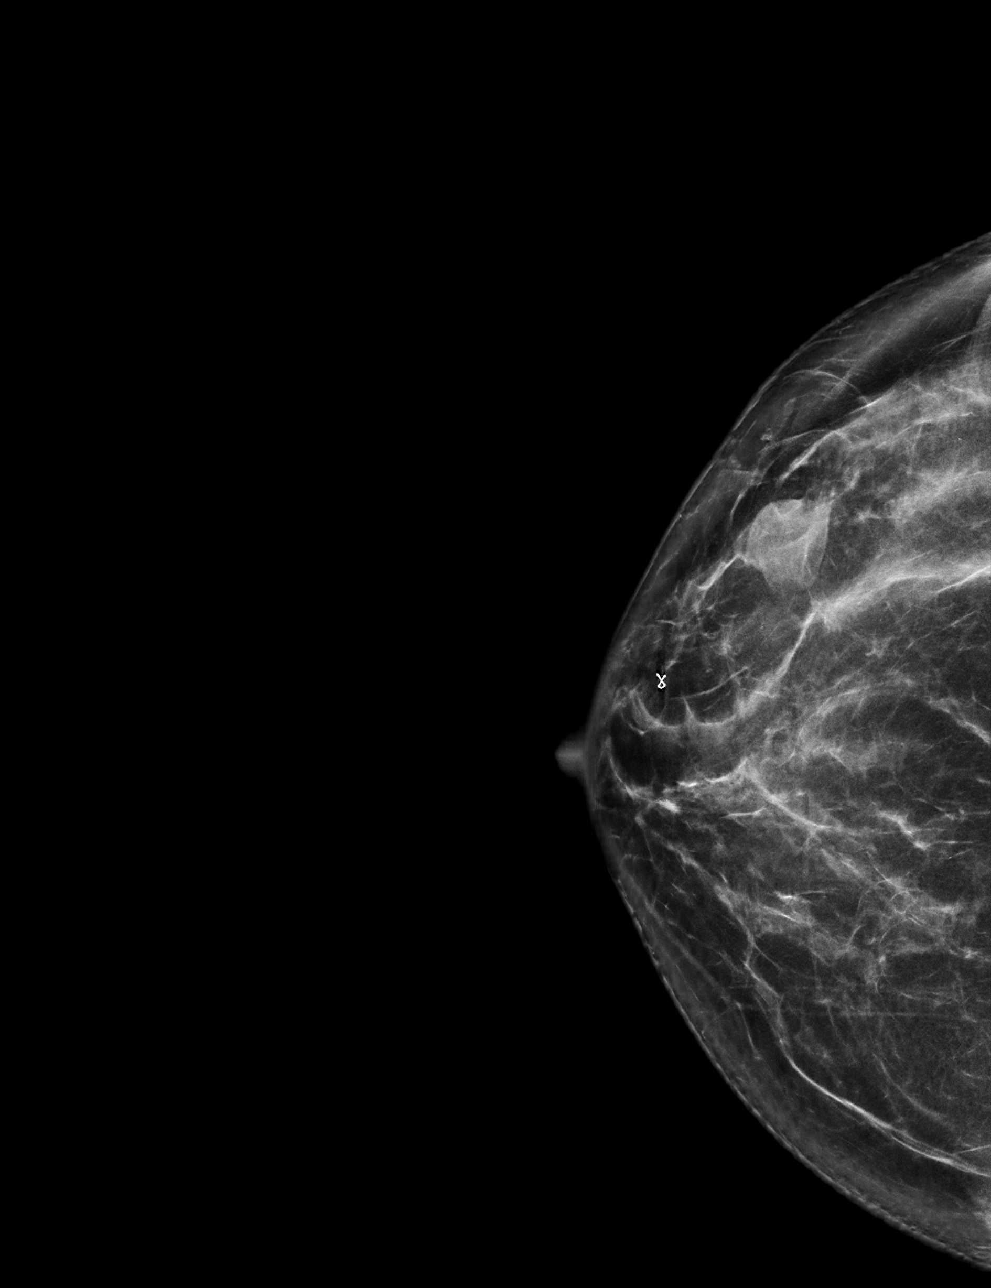

[R CC tomo · tomo slice 33/66.0]
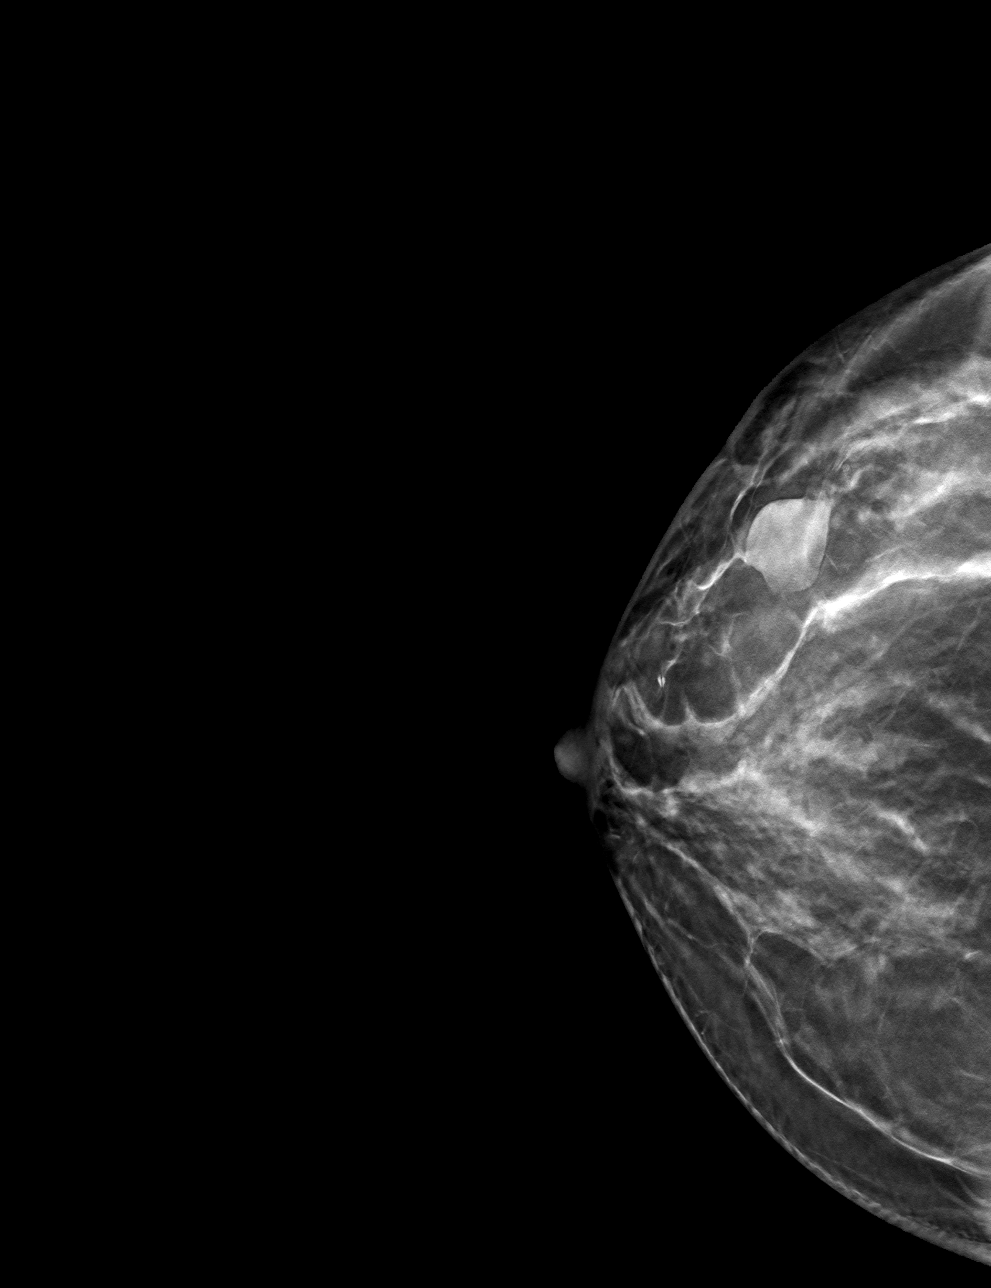

[R ML tomo · tomo slice 39/76.0]
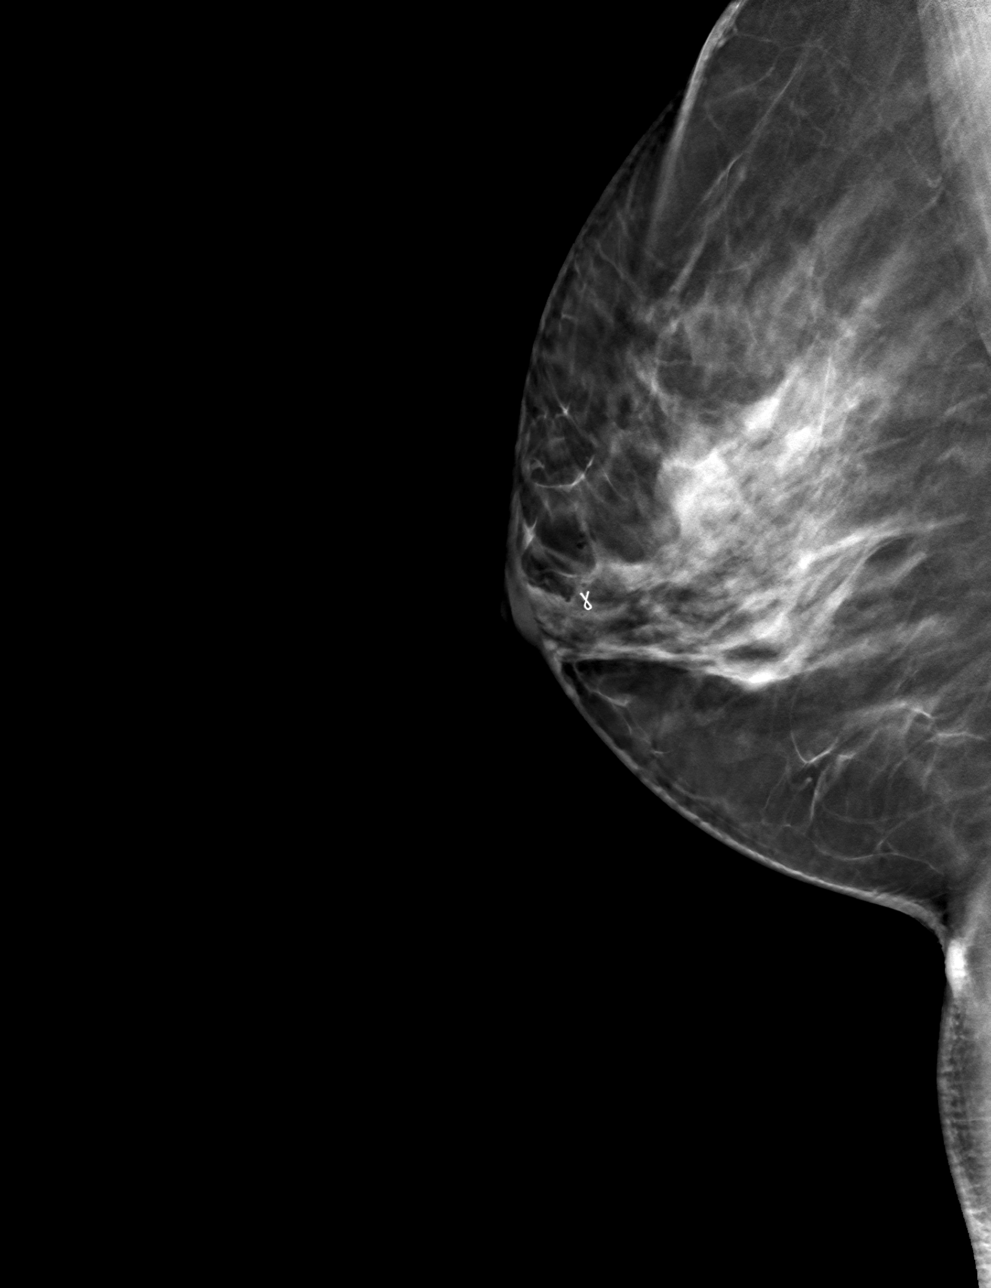

[4 of 12 positions shown; findings below may reference images not displayed]

FINDINGS: Mammographic images were obtained following ultrasound guided biopsy
of mass at right breast 10 o'clock 1 cm from nipple. The biopsy
marking clip is in expected position at the site of biopsy.
IMPRESSION: Appropriate positioning of the ribbon shaped biopsy marking clip at
the site of biopsy in the expected area of concern.

Final Assessment: Post Procedure Mammograms for Marker Placement

## 2021-08-02 IMAGING — US US  BREAST BX W/ LOC DEV 1ST LESION IMG BX SPEC US GUIDE*R*
1 series · 6 of 6 positions shown · non-contrast
Comparison: Previous exam(s).
COMPARISON: Previous exam(s).

Addendum:
CLINICAL DATA: Right breast mass for biopsy

EXAM:
ULTRASOUND GUIDED right BREAST CORE NEEDLE BIOPSY

[Series 1: us breast bx w/ loc dev 1st lesion img bx spec us  · 0.07mm/px · 6 of 6 slices shown]
[im 1/6]
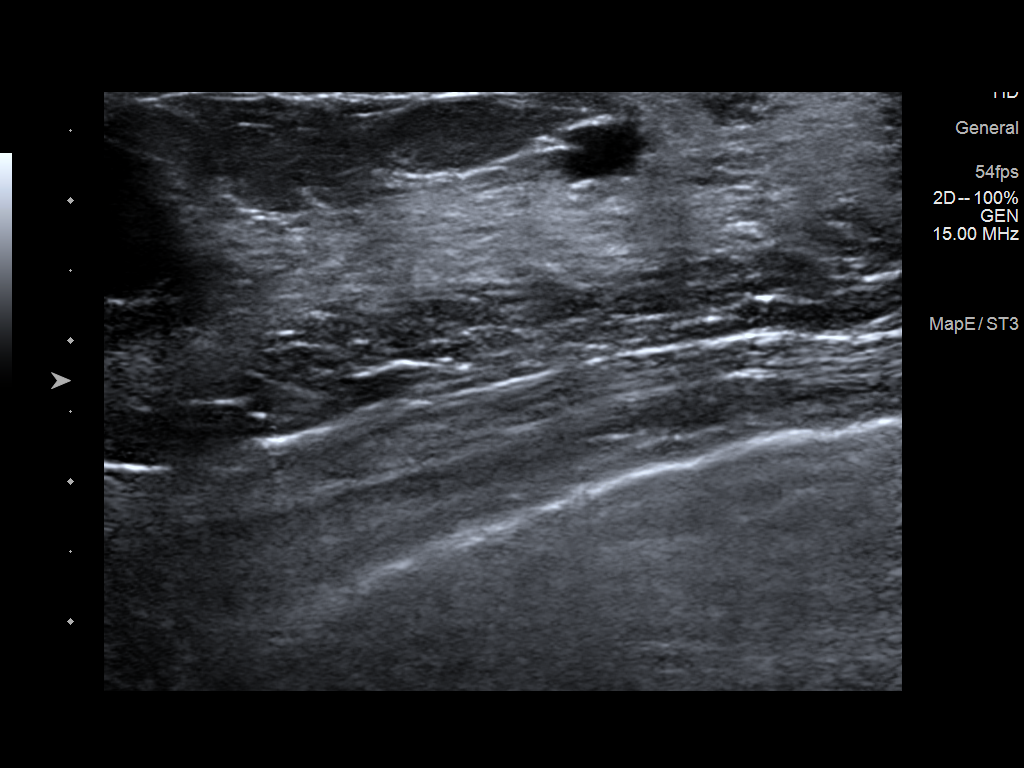
[im 2/6]
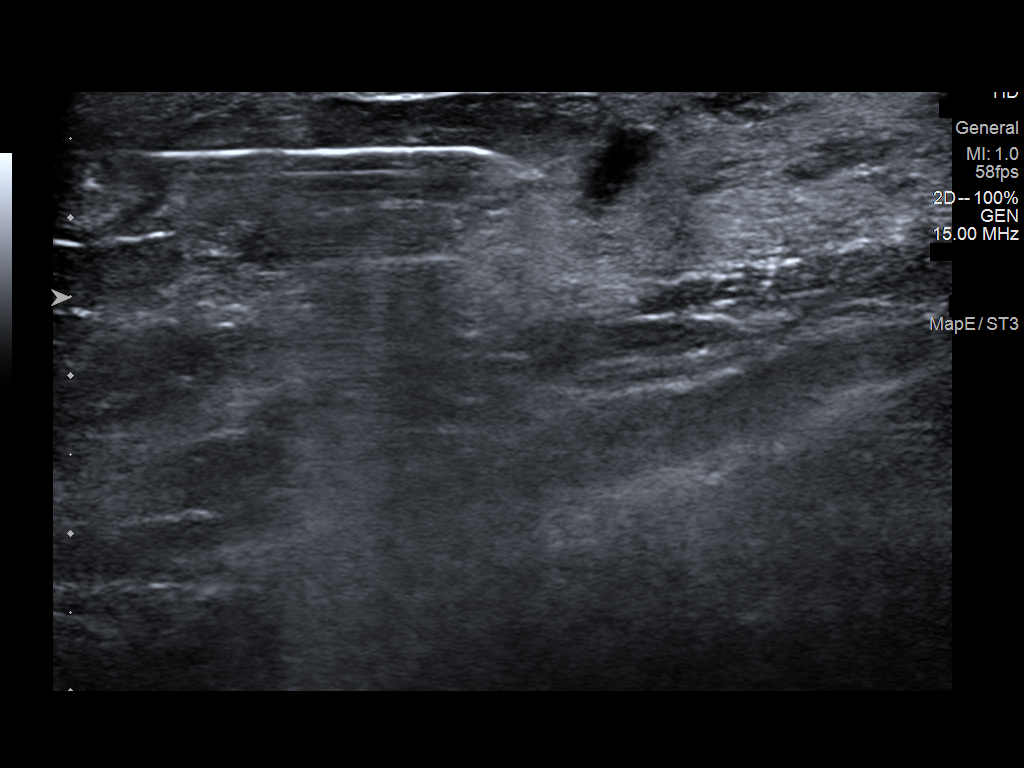
[im 3/6]
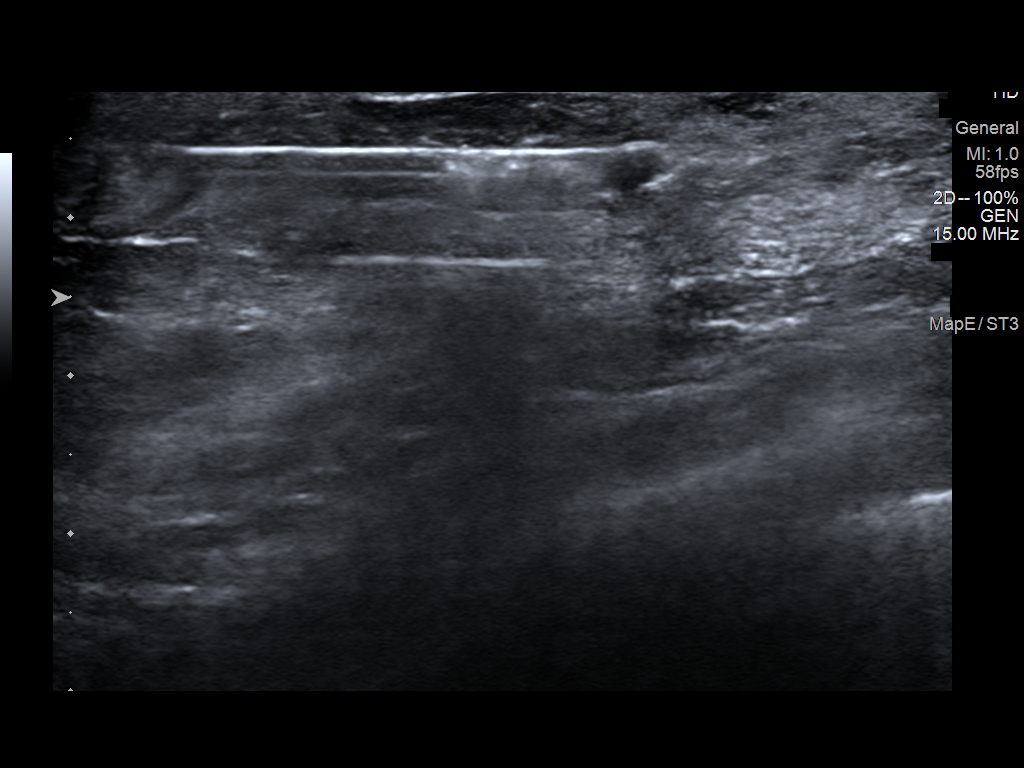
[im 4/6]
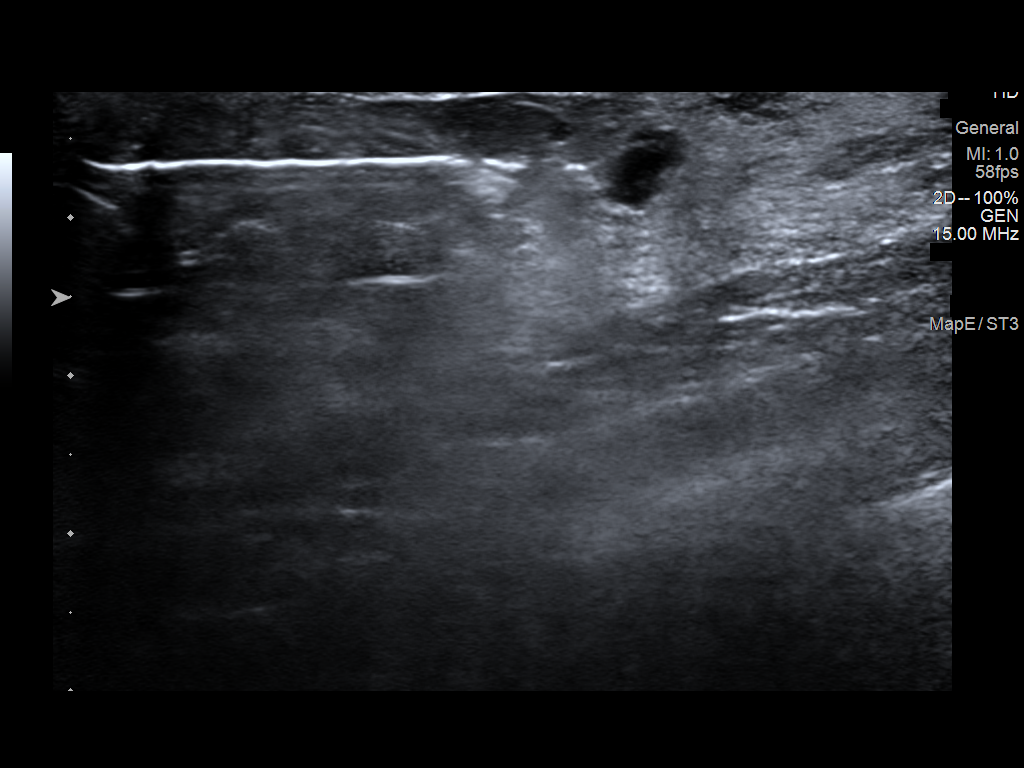
[im 5/6]
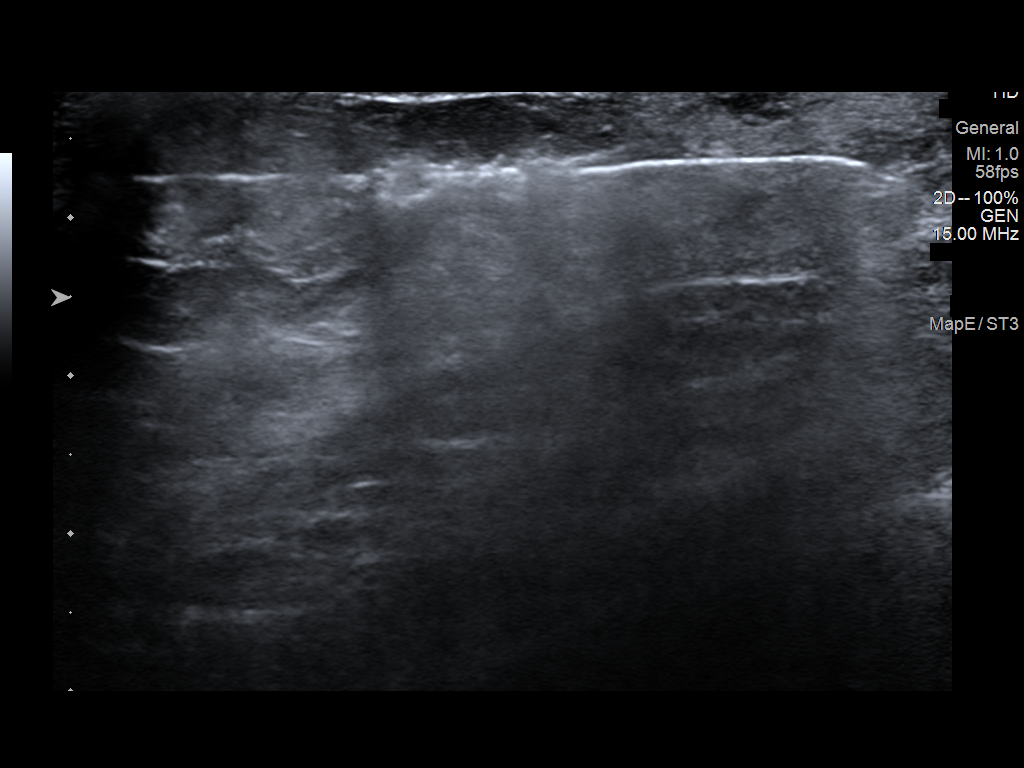
[im 6/6]
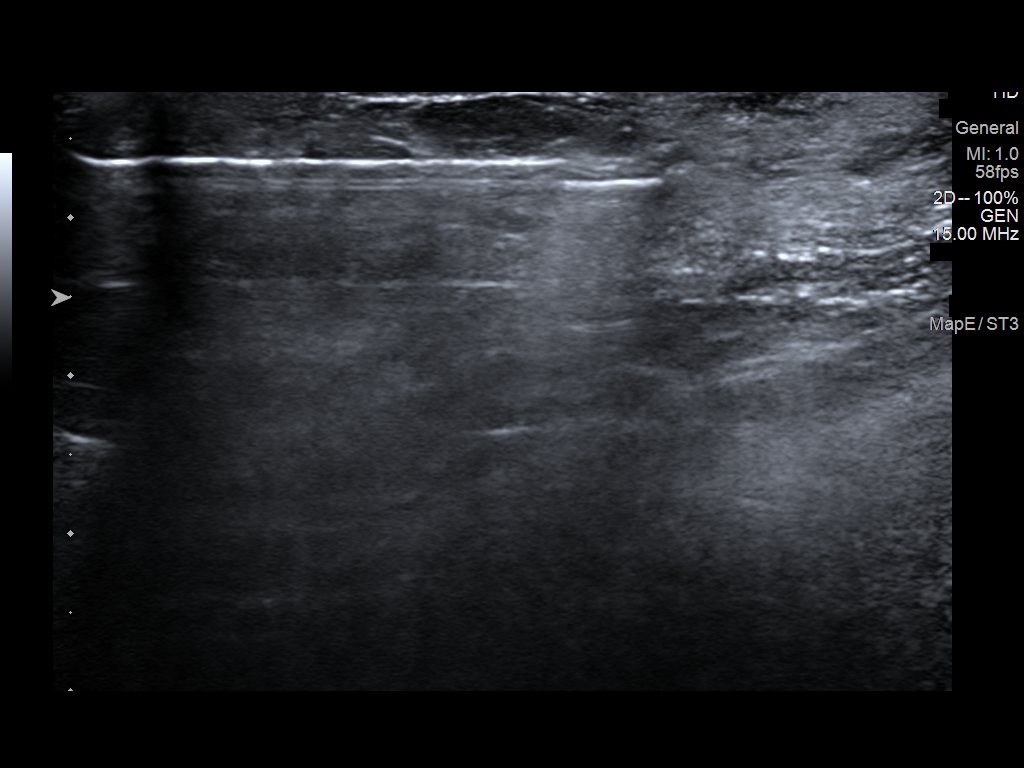

[6 of 6 positions shown; findings below may reference images not displayed]



Lesion quadrant: Upper-outer quadrant

Using sterile technique and 1% Lidocaine as local anesthetic, under
direct ultrasound visualization, a 14 gauge Prionti device was
used to perform biopsy of near anechoic mass at the right breast 10
o'clock 1 cm from nipple using a bilateral approach. At the
conclusion of the procedure ribbon tissue marker clip was deployed
into the biopsy cavity. Follow up 2 view mammogram was performed and
dictated separately.
IMPRESSION: Ultrasound guided biopsy of right breast. No apparent complications.

ADDENDUM:
Pathology revealed FIBROCYSTIC CHANGE of the Right breast, 10
o'clock, 1 cmfn. This was found to be concordant by Dr. Ebadat
Tetsop.

Pathology results were discussed with the patient by telephone by
patient reported doing well after the biopsy with tenderness at the
site. Post biopsy instructions and care were reviewed and questions
were answered. The patient was encouraged to call The [REDACTED]

The patient was asked to return for a Right breast ultrasound in 6
months, per previous diagnostic report.

Pathology results reported by Ariella Ehlers, RN on 12/23/2020.



Lesion quadrant: Upper-outer quadrant

Using sterile technique and 1% Lidocaine as local anesthetic, under
direct ultrasound visualization, a 14 gauge Prionti device was
used to perform biopsy of near anechoic mass at the right breast 10
o'clock 1 cm from nipple using a bilateral approach. At the
conclusion of the procedure ribbon tissue marker clip was deployed
into the biopsy cavity. Follow up 2 view mammogram was performed and
dictated separately.
IMPRESSION: Ultrasound guided biopsy of right breast. No apparent complications.

## 2022-02-23 ENCOUNTER — Ambulatory Visit: Payer: Self-pay | Admitting: Critical Care Medicine

## 2022-04-01 ENCOUNTER — Ambulatory Visit: Payer: Self-pay | Attending: Critical Care Medicine | Admitting: Critical Care Medicine

## 2022-04-01 ENCOUNTER — Encounter: Payer: Self-pay | Admitting: Critical Care Medicine

## 2022-04-01 VITALS — BP 98/69 | HR 74 | Temp 98.7°F | Resp 16 | Ht 60.75 in | Wt 147.0 lb

## 2022-04-01 DIAGNOSIS — Z975 Presence of (intrauterine) contraceptive device: Secondary | ICD-10-CM

## 2022-04-01 DIAGNOSIS — K219 Gastro-esophageal reflux disease without esophagitis: Secondary | ICD-10-CM

## 2022-04-01 DIAGNOSIS — Z139 Encounter for screening, unspecified: Secondary | ICD-10-CM

## 2022-04-01 DIAGNOSIS — Z1159 Encounter for screening for other viral diseases: Secondary | ICD-10-CM

## 2022-04-01 DIAGNOSIS — N6311 Unspecified lump in the right breast, upper outer quadrant: Secondary | ICD-10-CM

## 2022-04-01 MED ORDER — FAMOTIDINE 20 MG PO TABS: 20.0000 mg | ORAL_TABLET | Freq: Every day | ORAL | 6 refills | Status: AC

## 2022-04-01 NOTE — Progress Notes (Signed)
Concerns with pressure and burning in chest for 2 years.  ? ?

## 2022-04-01 NOTE — Patient Instructions (Addendum)
Please review lifestyle medicine handout for healthy food options and exercise options ? ?See attachment regarding food options to focus on to reduce acid and take Pepcid 1 pill daily for acid sent to your pharmacy [facility] ? ?Complete health screenings will be obtained ? ?Ultrasound of right breast will be ordered ? ?Please pick up orange card application on the way out you will have to process this and mail it and documents back in once approved we can consider you for a tetanus vaccine ? ?Return to see Dr. Joya Gaskins 6 months ? ? ? ?Revise el folleto de medicina de estilo de vida para conocer opciones de alimentos saludables y opciones de ejercicio. ? ?Consulte el archivo adjunto con respecto a las opciones de alimentos en las que concentrarse para reducir el ?cido y tome Pepcid 1 pastilla al d?a para el ?cido ? ?Se obtendr?n ex?menes de salud completos. ? ?Se ordenar? ecograf?a de mama derecha. ? ?Recoja la solicitud de la tarjeta naranja al salir. Tendr? que procesarla y enviarla por correo junto con los documentos una vez [place], [place] considerarlo para una vacuna contra el t?tanos. ? ?Volver a ver al Dr. Joya Gaskins 6 meses ?

## 2022-04-01 NOTE — Assessment & Plan Note (Signed)
Complete set of health screenings will be obtained ?

## 2022-04-01 NOTE — Assessment & Plan Note (Signed)
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

## 2022-04-01 NOTE — Assessment & Plan Note (Signed)
Nexplanon in place management per health department ?

## 2022-04-01 NOTE — Assessment & Plan Note (Signed)
Chest pressure syndrome and reflux symptoms patient given reflux diet and will begin Pepcid 20 mg daily and observe ?

## 2022-04-01 NOTE — Progress Notes (Signed)
? ?New Patient Office Visit ? ?Subjective   ? ?Patient ID: Marissa Meyers, female    DOB: 04-15-1986  Age: 36 y.o. MRN: [id number] ? ?CC:  ?Chief Complaint  ?Patient presents with  ? New Patient (Initial Visit)  ? ? ?HPI ?Marissa Meyers presents to establish care ?Patient is here to establish care and in person Spanish interpreter's zoila was present for the visit ?The patient has a history of a lump in the right breast she had imaging done in 2022 and they wanted to repeat ultrasound in 6 months that she is yet to occur.  She does have a Nexplanon implant and has been changed every 3 years this is done at the health department.  Last menstrual period was 5 months ago.  The patient is trying to apply for orange card at this time.  She has occasional chest pressure and reflux symptoms.  There are no other complaints. ? [facility]stream - 04/01/22 0940   ? ?  ? Pregnancy Intention Screening  ? Does the patient want to become pregnant in the next year? No   ? Does the patient's partner want to become pregnant in the next year? No   ? Would the patient like to discuss contraceptive options today? N/A   ?  ? Contraception Wrap Up  ? Current Method Hormonal Implant   ? Contraception Counseling Provided Yes   ? ?  ?  ? ?  ? ?Outpatient Encounter Medications as of 04/01/2022  ?Medication Sig  ? famotidine (PEPCID) 20 MG tablet Take 1 tablet (20 mg total) by mouth daily.  ? [DISCONTINUED] HYDROcodone-acetaminophen (NORCO/VICODIN) 5-325 MG per tablet Take 1 tablet by mouth every 4 (four) hours as needed for moderate pain.  ? ?No facility-administered encounter medications on file as of 04/01/2022.  ? ? ?History reviewed. No pertinent past medical history. ? ?History reviewed. No pertinent surgical history. ? ?Family History  ?Problem Relation Age of Onset  ? Healthy Mother   ? Healthy Father   ? ? ?Social History  ? ?Socioeconomic History  ? Marital status: Married  ?  Spouse name: Not on file  ? Number of children: 4  ?  Years of education: Not on file  ? Highest education level: 8th grade  ?Occupational History  ? Not on file  ?Tobacco Use  ? Smoking status: Never  ? Smokeless tobacco: Never  ?Vaping Use  ? Vaping Use: Never used  ?Substance and Sexual Activity  ? Alcohol use: No  ? Drug use: No  ? Sexual activity: Not Currently  ?  Birth control/protection: Implant  ?Other Topics Concern  ? Not on file  ?Social History Narrative  ? Not on file  ? ?Social Determinants of Health  ? ?Financial Resource Strain: Not on file  ?Food Insecurity: Not on file  ?Transportation Needs: Not on file  ?Physical Activity: Not on file  ?Stress: Not on file  ?Social Connections: Not on file  ?Intimate Partner Violence: Not on file  ? ? ?Review of Systems  ?Constitutional:  Negative for chills, diaphoresis, fever, malaise/fatigue and weight loss.  ?HENT:  Negative for congestion, ear discharge, ear pain, hearing loss, nosebleeds, sore throat and tinnitus.   ?Eyes:  Negative for blurred vision, double vision, photophobia, discharge and redness.  ?Respiratory: Negative.  Negative for cough, hemoptysis, sputum production, shortness of breath, wheezing and stridor.   ?     No excess mucus  ?Cardiovascular:  Positive for chest pain. Negative for palpitations, orthopnea, claudication, leg swelling  and PND.  ?     Right sided chest pressure  ?Gastrointestinal:  Positive for heartburn. Negative for abdominal pain, blood in stool, constipation, diarrhea, melena, nausea and vomiting.  ?     Acid heartburn   ?Genitourinary:  Negative for dysuria, flank pain, frequency, hematuria and urgency.  ?Musculoskeletal:  Negative for back pain, falls, joint pain, myalgias and neck pain.  ?Skin:  Negative for itching and rash.  ?Neurological:  Negative for dizziness, tingling, tremors, sensory change, speech change, focal weakness, seizures, loss of consciousness, weakness and headaches.  ?Endo/Heme/Allergies:  Negative for environmental allergies and polydipsia. Does not  bruise/bleed easily.  ?Psychiatric/Behavioral:  Negative for depression, hallucinations, memory loss, substance abuse and suicidal ideas. The patient is not nervous/anxious and does not have insomnia.   ?All other systems reviewed and are negative. ? ?  ? ? ?Objective   ? ?BP 98/69 (BP Location: Left Arm, Patient Position: Sitting, Cuff Size: Normal)   Pulse 74   Temp 98.7 ?F (37.1 ?C) (Oral)   Resp 16   Ht 5' 0.75" (1.543 m)   Wt 147 lb (66.7 kg)   SpO2 98%   BMI 28.00 kg/m?  ? ?Physical Exam ?Vitals reviewed.  ?Constitutional:   ?   Appearance: Normal appearance. She is well-developed. She is not diaphoretic.  ?HENT:  ?   Head: Normocephalic and atraumatic.  ?   Nose: No nasal deformity, septal deviation, mucosal edema or rhinorrhea.  ?   Right Sinus: No maxillary sinus tenderness or frontal sinus tenderness.  ?   Left Sinus: No maxillary sinus tenderness or frontal sinus tenderness.  ?   Mouth/Throat:  ?   Pharynx: No oropharyngeal exudate.  ?Eyes:  ?   General: No scleral icterus. ?   Conjunctiva/sclera: Conjunctivae normal.  ?   Pupils: Pupils are equal, round, and reactive to light.  ?Neck:  ?   Thyroid: No thyromegaly.  ?   Vascular: No carotid bruit or JVD.  ?   Trachea: Trachea normal. No tracheal tenderness or tracheal deviation.  ?Cardiovascular:  ?   Rate and Rhythm: Normal rate and regular rhythm.  ?   Chest Wall: PMI is not displaced.  ?   Pulses: Normal pulses. No decreased pulses.  ?   Heart sounds: Normal heart sounds, S1 normal and S2 normal. Heart sounds not distant. No murmur heard. ?No systolic murmur is present.  ?No diastolic murmur is present.  ?  No friction rub. No gallop. No S3 or S4 sounds.  ?Pulmonary:  ?   Effort: No tachypnea, accessory muscle usage or respiratory distress.  ?   Breath sounds: No stridor. No decreased breath sounds, wheezing, rhonchi or rales.  ?Chest:  ?   Chest wall: No tenderness.  ?Abdominal:  ?   General: Bowel sounds are normal. There is no distension.  ?    Palpations: Abdomen is soft. Abdomen is not rigid.  ?   Tenderness: There is no abdominal tenderness. There is no guarding or rebound.  ?Musculoskeletal:     ?   General: Normal range of motion.  ?   Cervical back: Normal range of motion and neck supple. No edema, erythema or rigidity. No muscular tenderness. Normal range of motion.  ?Lymphadenopathy:  ?   Head:  ?   Right side of head: No submental or submandibular adenopathy.  ?   Left side of head: No submental or submandibular adenopathy.  ?   Cervical: No cervical adenopathy.  ?Skin: ?  General: Skin is warm and dry.  ?   Coloration: Skin is not pale.  ?   Findings: No rash.  ?   Nails: There is no clubbing.  ?Neurological:  ?   Mental Status: She is alert and oriented to person, place, and time.  ?   Sensory: No sensory deficit.  ?Psychiatric:     ?   Speech: Speech normal.     ?   Behavior: Behavior normal.  ? ? ? ?  ? ?Assessment & Plan:  ? ?Problem List Items Addressed This Visit   ? ?  ? Digestive  ? Gastroesophageal reflux disease without esophagitis  ?  Chest pressure syndrome and reflux symptoms patient given reflux diet and will begin Pepcid 20 mg daily and observe ? ?  ?  ? Relevant Medications  ? famotidine (PEPCID) 20 MG tablet  ?  ? Other  ? Mass of upper outer quadrant of right breast  ?  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 ?  ?  ? Relevant Orders  ? US BREAST LTD UNI RIGHT INC AXILLA  ? Encounter for health-related screening  ?  Complete set of health screenings will be obtained ? ?  ?  ? Relevant Orders  ? Comprehensive metabolic panel  ? CBC with Differential/Platelet  ? Lipid panel  ? Thyroid Panel With TSH  ? Hemoglobin A1c  ? Nexplanon in place  ?  Nexplanon in place management per health department ? ?  ?  ? ?Other Visit Diagnoses   ? ? Need for hepatitis C screening test    -  Primary  ? Relevant Orders  ? HCV Ab w Reflex to Quant PCR   ? ?  ? ?Patient given application for the orange card when she achieves this we can give her a tetanus shot ?Return in about 6 months (around 10/02/2022).  ? ?Shan Levans, MD ? ? ?

## 2022-04-02 LAB — HEMOGLOBIN A1C
Est. average glucose Bld gHb Est-mCnc: 97 mg/dL
Hgb A1c MFr Bld: 5 % (ref 4.8–5.6)

## 2022-04-02 LAB — CBC WITH DIFFERENTIAL/PLATELET
Basophils Absolute: 0.1 10*3/uL (ref 0.0–0.2)
Basos: 1 %
EOS (ABSOLUTE): 0.4 10*3/uL (ref 0.0–0.4)
Eos: 4 %
Hematocrit: 42.8 % (ref 34.0–46.6)
Hemoglobin: 14 g/dL (ref 11.1–15.9)
Immature Grans (Abs): 0.1 10*3/uL (ref 0.0–0.1)
Immature Granulocytes: 1 %
Lymphocytes Absolute: 2.3 10*3/uL (ref 0.7–3.1)
Lymphs: 24 %
MCH: 29.5 pg (ref 26.6–33.0)
MCHC: 32.7 g/dL (ref 31.5–35.7)
MCV: 90 fL (ref 79–97)
Monocytes Absolute: 0.6 10*3/uL (ref 0.1–0.9)
Monocytes: 6 %
Neutrophils Absolute: 6.3 10*3/uL (ref 1.4–7.0)
Neutrophils: 64 %
Platelets: 237 10*3/uL (ref 150–450)
RBC: 4.74 x10E6/uL (ref 3.77–5.28)
RDW: 12.7 % (ref 11.7–15.4)
WBC: 9.8 10*3/uL (ref 3.4–10.8)

## 2022-04-02 LAB — COMPREHENSIVE METABOLIC PANEL
ALT: 18 IU/L (ref 0–32)
AST: 11 IU/L (ref 0–40)
Albumin/Globulin Ratio: 1.8 (ref 1.2–2.2)
Albumin: 4.8 g/dL (ref 3.8–4.8)
Alkaline Phosphatase: 117 IU/L (ref 44–121)
BUN/Creatinine Ratio: 17 (ref 9–23)
BUN: 10 mg/dL (ref 6–20)
Bilirubin Total: 1 mg/dL (ref 0.0–1.2)
CO2: 22 mmol/L (ref 20–29)
Calcium: 9.6 mg/dL (ref 8.7–10.2)
Chloride: 103 mmol/L (ref 96–106)
Creatinine, Ser: 0.6 mg/dL (ref 0.57–1.00)
Globulin, Total: 2.6 g/dL (ref 1.5–4.5)
Glucose: 81 mg/dL (ref 70–99)
Potassium: 4.5 mmol/L (ref 3.5–5.2)
Sodium: 140 mmol/L (ref 134–144)
Total Protein: 7.4 g/dL (ref 6.0–8.5)
eGFR: 120 mL/min/{1.73_m2} (ref >59)

## 2022-04-02 LAB — LIPID PANEL
Chol/HDL Ratio: 4.6 ratio — ABNORMAL HIGH (ref 0.0–4.4)
Cholesterol, Total: 155 mg/dL (ref 100–199)
HDL: 34 mg/dL — ABNORMAL LOW (ref >39)
LDL Chol Calc (NIH): 90 mg/dL (ref 0–99)
Triglycerides: 178 mg/dL — ABNORMAL HIGH (ref 0–149)
VLDL Cholesterol Cal: 31 mg/dL (ref 5–40)

## 2022-04-02 LAB — THYROID PANEL WITH TSH
Free Thyroxine Index: 2.8 (ref 1.2–4.9)
T3 Uptake Ratio: 32 % (ref 24–39)
T4, Total: 8.6 ug/dL (ref 4.5–12.0)
TSH: 1.62 u[IU]/mL (ref 0.450–4.500)

## 2022-04-02 LAB — HCV INTERPRETATION

## 2022-04-02 LAB — HCV AB W REFLEX TO QUANT PCR: HCV Ab: NONREACTIVE

## 2022-04-02 NOTE — Progress Notes (Signed)
Let patient know all labs normal hep c neg

## 2022-04-13 ENCOUNTER — Other Ambulatory Visit: Payer: Self-pay

## 2022-04-13 DIAGNOSIS — N6310 Unspecified lump in the right breast, unspecified quadrant: Secondary | ICD-10-CM

## 2022-04-27 ENCOUNTER — Ambulatory Visit: Payer: Self-pay | Admitting: *Deleted

## 2022-04-27 VITALS — BP 100/60 | Wt 148.9 lb

## 2022-04-27 DIAGNOSIS — N6311 Unspecified lump in the right breast, upper outer quadrant: Secondary | ICD-10-CM

## 2022-04-27 DIAGNOSIS — Z1239 Encounter for other screening for malignant neoplasm of breast: Secondary | ICD-10-CM

## 2022-04-27 NOTE — Progress Notes (Signed)
Marissa Meyers is a 36 y.o. female who presents to [facility] clinic today with complaint of right breast lump x 2.5 years noted on previous exam 12/09/2020. Patient had a diagnostic mammogram completed 12/09/2020 that a right breast biopsy was recommended for follow up that was completed 12/22/2020 that a 71-month right breast ultrasound was recommended that was not completed.   Pap Smear: Pap smear not completed today. Last Pap smear was 07/15/2020 at the [facility] clinic and was normal with negative HPV. Per patient has no history of an abnormal Pap smear. Last Pap smear result is available in Epic.  Physical exam: Breasts Breasts symmetrical. No skin abnormalities bilateral breasts. No nipple retraction bilateral breasts. No nipple discharge bilateral breasts. No lymphadenopathy. No lumps palpated left breast. Palpated a lump within the right breast at 10 o'clock 3 cm from the nipple consistent with previous exam 12/09/2020. No complaints of pain or tenderness on exam.   MS DIGITAL DIAG TOMO BILAT  Result Date: 12/09/2020 CLINICAL DATA:  36 year old female with a palpable lump in the upper-outer right breast for 8 months without significant interval change. The patient's provider palpated an additional lump closer to the nipple. EXAM: DIGITAL DIAGNOSTIC BILATERAL MAMMOGRAM WITH CAD AND TOMO ULTRASOUND RIGHT BREAST COMPARISON:  None. ACR Breast Density Category c: The breast tissue is heterogeneously dense, which may obscure small masses. FINDINGS: Radiopaque BBs were placed at the site of the patient's palpable lumps in the upper-outer right breast. An oval, circumscribed equal density mass is seen deep to the more posterior BB at the site of the patient palpated lump. No focal findings are seen deep to the anterior BB. No focal or suspicious mammographic findings are identified in the remainder of either breast. Mammographic images were processed with CAD. Targeted ultrasound  is performed, showing an oval, circumscribed hypoechoic mass at the 10 o'clock position 4 cm from the nipple. It measures 1.5 x 1.5 x 1.1 cm. There is peripheral but no internal vascularity. This correlates with the mammographic finding in the patient's self palpated mass. An irregular mass with irregular borders is identified at the 10 o'clock position 1 cm from the nipple. It is nearly anechoic, measuring 8 x 5 x 5 mm. There is no internal vascularity. This is at the site of the provided palpated lump. Evaluation of the right axilla demonstrates no suspicious lymphadenopathy. IMPRESSION: 1. Indeterminate right breast mass at the 10 o'clock position 1 cm from the nipple with irregular borders. Recommendation is for ultrasound-guided biopsy. 2. Probably benign right breast mass at the 10 o'clock position 4 cm from the nipple likely representing a fibroadenoma. The patient palpated this area 8 months ago, with no interval change. 3. No suspicious right axillary lymphadenopathy. 4. No mammographic evidence of malignancy on the left. RECOMMENDATION: 1. Ultrasound-guided biopsy of the right breast at the 10 o'clock position 1 cm from the nipple. 2. If biopsy demonstrates benign results, six-month ultrasound follow-up is recommended for the additional right breast mass at the 10 o'clock position 4 cm from the nipple. I have discussed the findings and recommendations with the patient. If applicable, a reminder letter will be sent to the patient regarding the next appointment. BI-RADS CATEGORY  4: Suspicious. Electronically Signed   By: Sande Brothers M.D.   On: 12/09/2020 14:39   MM CLIP PLACEMENT RIGHT  Result Date: 12/22/2020 CLINICAL DATA:  Status post ultrasound-guided core biopsy of right breast mass EXAM: DIAGNOSTIC right MAMMOGRAM POST ultrasound BIOPSY COMPARISON:  Prior films  FINDINGS: Mammographic images were obtained following ultrasound guided biopsy of mass at right breast 10 o'clock 1 cm from nipple. The  biopsy marking clip is in expected position at the site of biopsy. IMPRESSION: Appropriate positioning of the ribbon shaped biopsy marking clip at the site of biopsy in the expected area of concern. Final Assessment: Post Procedure Mammograms for Marker Placement Electronically Signed   By: Sherian Rein M.D.   On: 12/22/2020 13:17     Pelvic/Bimanual Pap is not indicated today per BCCCP guidelines.   Smoking History: Patient has never smoked.   Patient Navigation: Patient education provided. Access to services provided for patient through [place] program. Spanish interpreter Natale Lay from [facility] provided.   Breast and Cervical Cancer Risk Assessment: Patient does not have family history of breast cancer, known genetic mutations, or radiation treatment to the chest before age 89. Patient does not have history of cervical dysplasia, immunocompromised, or DES exposure in-utero.  Risk Assessment     Risk Scores       04/27/2022 12/09/2020   Last edited by: Narda Rutherford, LPN [place], Sherie Demetrius Charity, LPN   5-year risk: 0.1 %    Lifetime risk: 5.3 %             A: BCCCP exam without pap smear Complaint of right breast lump.  P: Referred patient to the [facility] for a diagnostic mammogram per recommendation. Appointment scheduled Tuesday, May 04, 2022 at 0950.  Priscille Heidelberg, RN 04/27/2022 3:38 PM

## 2022-04-27 NOTE — Patient Instructions (Signed)
Explained breast self awareness with Marissa Meyers. Patient did not need a Pap smear today due to last Pap smear and HPV typing was 07/15/2020. Let her know [facility] will cover Pap smears and HPV typing every 5 years unless has a history of abnormal Pap smears. Referred patient to the [facility] for a diagnostic mammogram per recommendation. Appointment scheduled Tuesday, May 04, 2022 at 0950. Patient aware of appointment and will be there.  Marissa Meyers verbalized understanding.  Gurtha Picker, Kathaleen Maser, RN 3:38 PM

## 2022-04-29 ENCOUNTER — Ambulatory Visit
Admission: RE | Admit: 2022-04-29 | Discharge: 2022-04-29 | Disposition: A | Discharge status: Home / Self Care | Payer: Self-pay | Source: Ambulatory Visit | Attending: Obstetrics and Gynecology | Admitting: Obstetrics and Gynecology

## 2022-04-29 ENCOUNTER — Other Ambulatory Visit: Payer: Self-pay | Admitting: Obstetrics and Gynecology

## 2022-04-29 DIAGNOSIS — N6310 Unspecified lump in the right breast, unspecified quadrant: Secondary | ICD-10-CM

## 2022-10-04 ENCOUNTER — Ambulatory Visit: Payer: Self-pay | Admitting: Critical Care Medicine

## 2022-10-04 ENCOUNTER — Ambulatory Visit: Payer: Self-pay | Admitting: Internal Medicine

## 2022-11-01 ENCOUNTER — Ambulatory Visit
Admission: RE | Admit: 2022-11-01 | Discharge: 2022-11-01 | Disposition: A | Discharge status: Home / Self Care | Payer: No Typology Code available for payment source | Source: Ambulatory Visit | Attending: Obstetrics and Gynecology | Admitting: Obstetrics and Gynecology

## 2022-11-01 DIAGNOSIS — N6310 Unspecified lump in the right breast, unspecified quadrant: Secondary | ICD-10-CM

## 2022-12-08 IMAGING — US US BREAST*R* LIMITED INC AXILLA
1 series · 6 of 6 positions shown · non-contrast
Comparison: Previous exams.
COMPARISON: Previous exams.

Addendum:
CLINICAL DATA: Short-term follow-up for probably benign right
breast mass. History of benign biopsy of an additional mass in the
right breast November 2020 with pathology revealing fibrocystic
change.

EXAM:
ULTRASOUND OF THE RIGHT BREAST

[Series 1: us breast*right* limited inc axilla · 0.06mm/px · 6 of 6 slices shown]
[im 1/6]
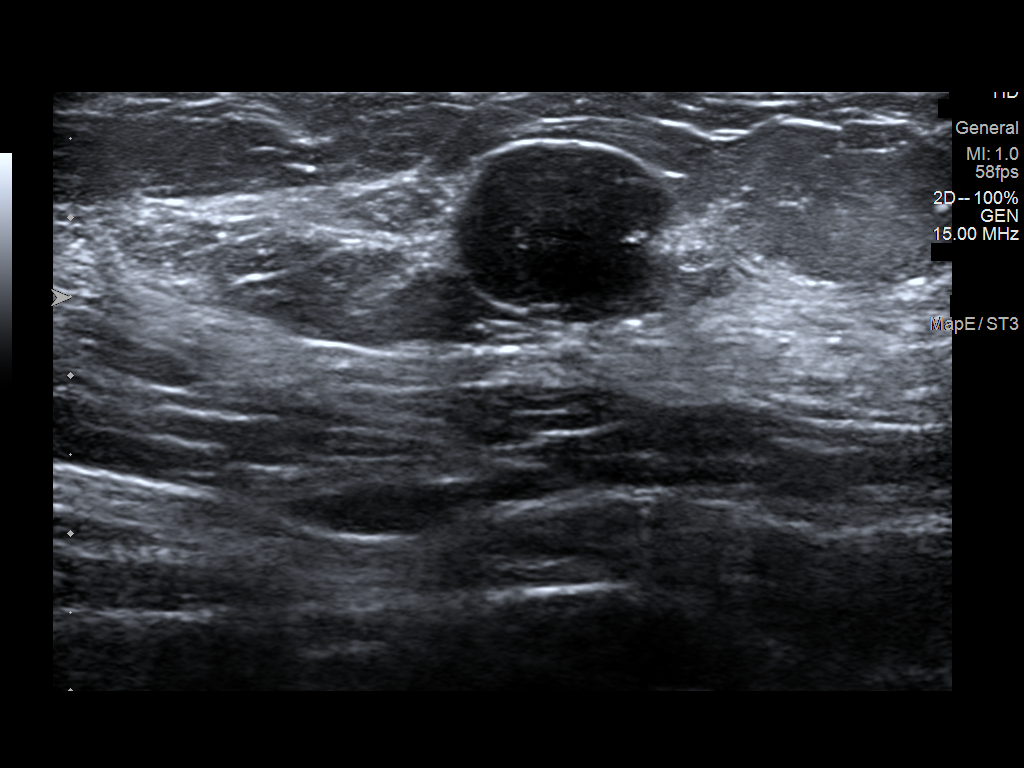
[im 2/6]
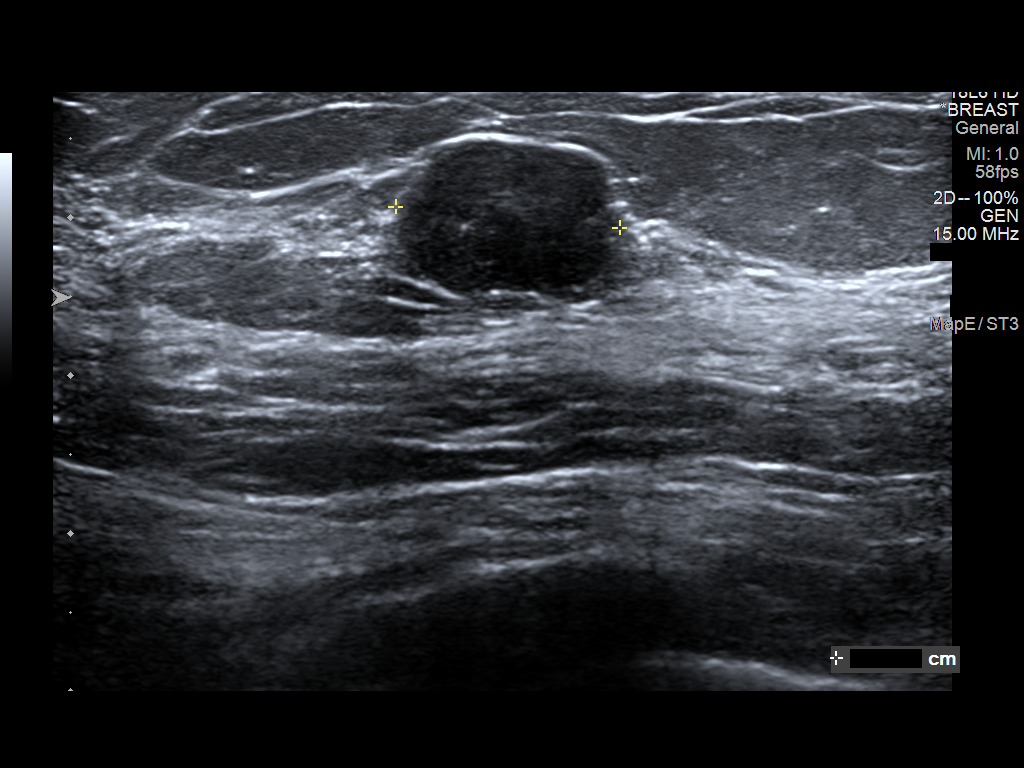
[im 3/6]
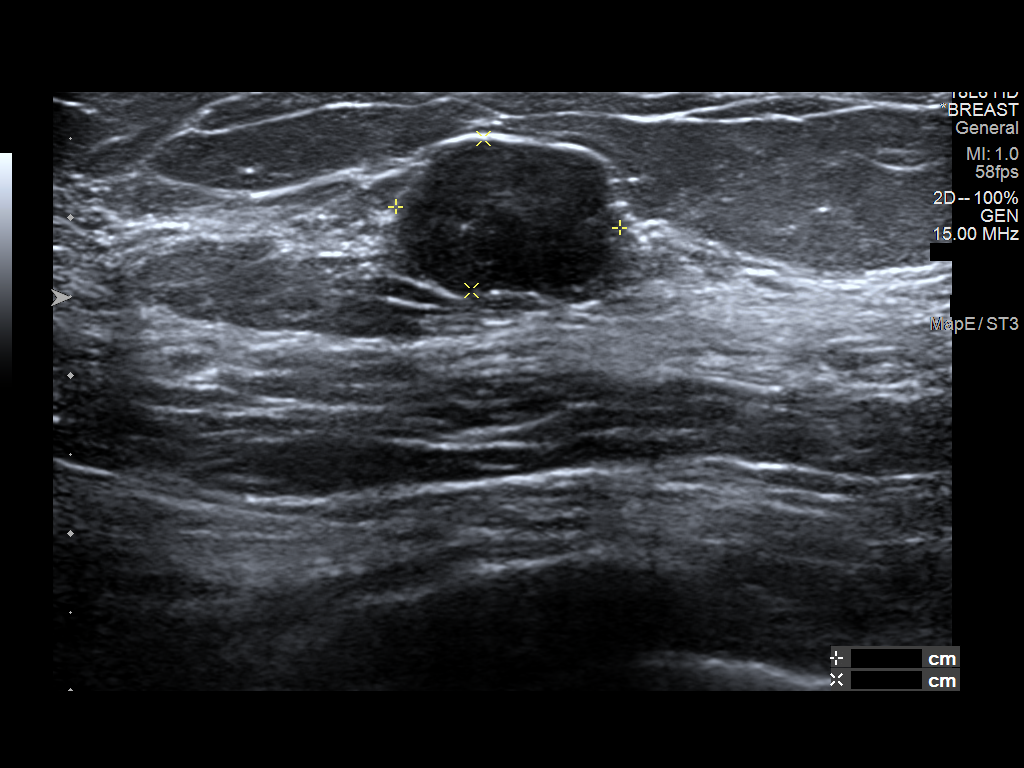
[im 4/6]
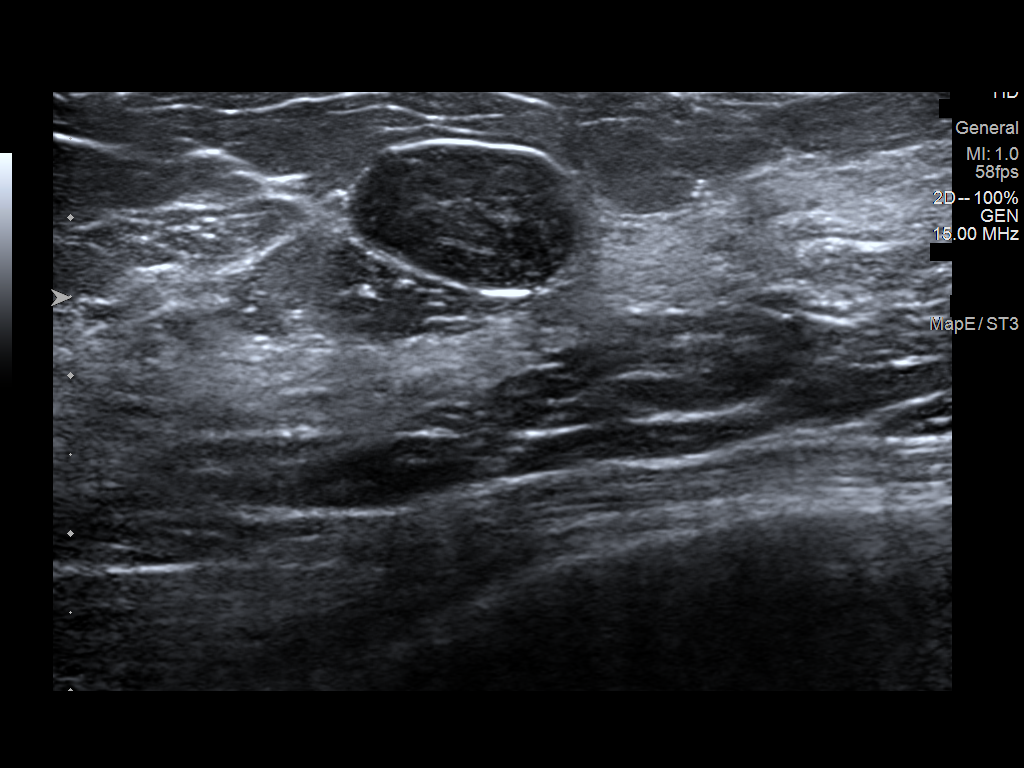
[im 5/6]
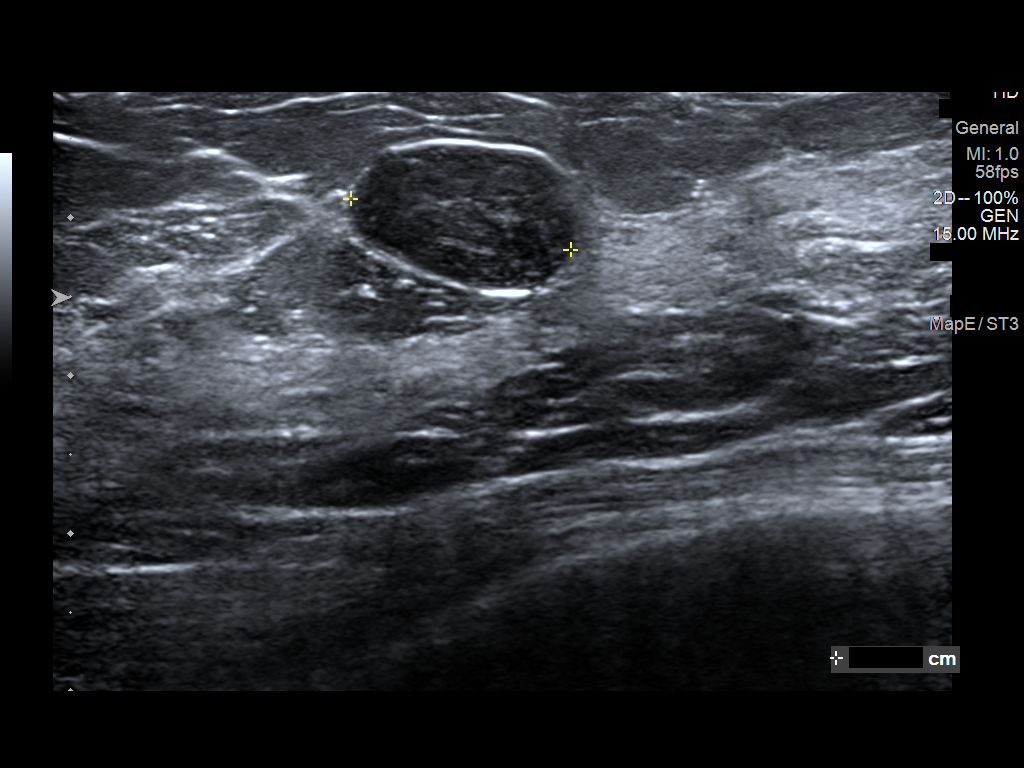
[im 6/6]
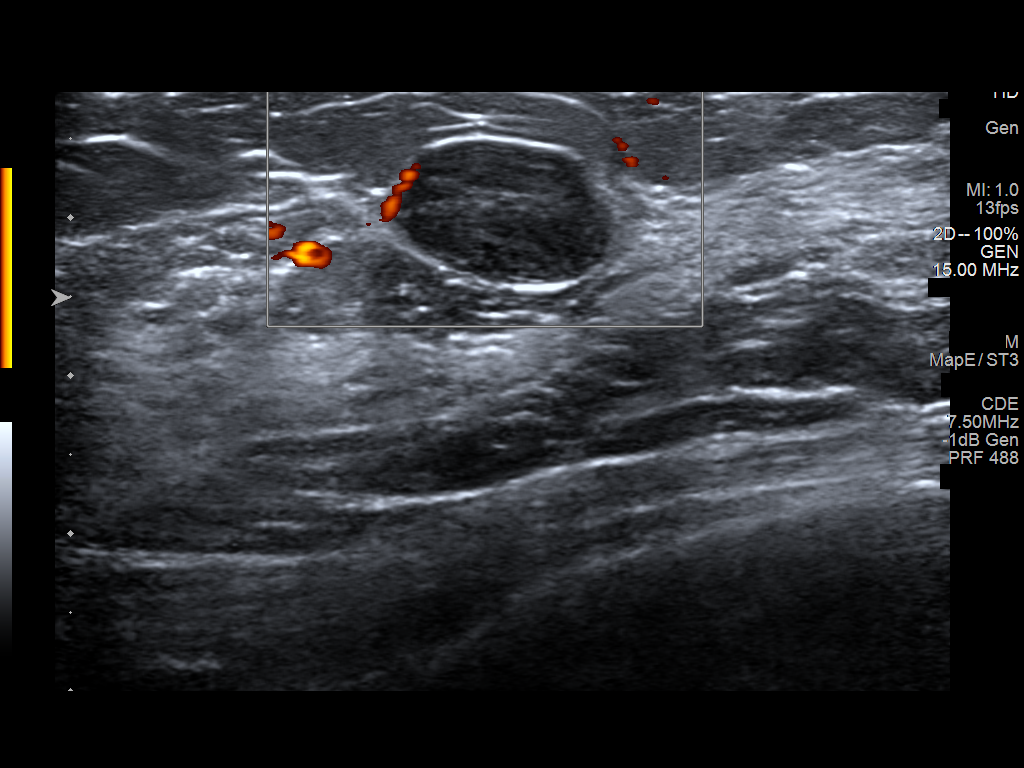

[6 of 6 positions shown; findings below may reference images not displayed]

FINDINGS: Targeted ultrasound of the right breast was performed. The oval
circumscribed mass in the right breast at 10 o'clock 4 cm from
nipple measures 1.4 x 1 x 1.4 cm, unchanged in size in appearance
when compared to the prior exam.
IMPRESSION: Stable probably benign right breast mass.

RECOMMENDATION:
Recommend bilateral diagnostic mammography with right breast
ultrasound November 2021 which will demonstrate 6 months of stability
of the probably benign right breast mass.

I have discussed the findings and recommendations with the patient.
If applicable, a reminder letter will be sent to the patient
regarding the next appointment.

BI-RADS CATEGORY  3: Probably benign.

ADDENDUM:
An addendum is made to the recommendation section of this report.
This should read as:

Recommendation: Recommend bilateral diagnostic mammography with
right breast ultrasound November 2022 which will demonstrate 2 years
of stability of the probably benign right breast mass.

*** End of Addendum ***
FINDINGS: Targeted ultrasound of the right breast was performed. The oval
circumscribed mass in the right breast at 10 o'clock 4 cm from
nipple measures 1.4 x 1 x 1.4 cm, unchanged in size in appearance
when compared to the prior exam.
IMPRESSION: Stable probably benign right breast mass.

RECOMMENDATION:
Recommend bilateral diagnostic mammography with right breast
ultrasound November 2021 which will demonstrate 6 months of stability
of the probably benign right breast mass.

I have discussed the findings and recommendations with the patient.
If applicable, a reminder letter will be sent to the patient
regarding the next appointment.

BI-RADS CATEGORY  3: Probably benign.
